# Patient Record
Sex: Female | Born: 1970 | Race: Black or African American | Hispanic: No | Marital: Single | Smoking: Never smoker
Health system: Southern US, Community
[De-identification: ages and names within clinical notes are randomized; demographics above are authoritative.]

## PROBLEM LIST (undated history)

## (undated) DIAGNOSIS — Z21 Asymptomatic human immunodeficiency virus [HIV] infection status: Secondary | ICD-10-CM

## (undated) DIAGNOSIS — D649 Anemia, unspecified: Secondary | ICD-10-CM

## (undated) DIAGNOSIS — B1920 Unspecified viral hepatitis C without hepatic coma: Secondary | ICD-10-CM

## (undated) DIAGNOSIS — B20 Human immunodeficiency virus [HIV] disease: Secondary | ICD-10-CM

## (undated) HISTORY — PX: TUBAL LIGATION: SHX77

---

## 2000-11-26 ENCOUNTER — Emergency Department (HOSPITAL_COMMUNITY): Admission: EM | Admit: 2000-11-26 | Discharge: 2000-11-26 | Payer: Self-pay | Admitting: Emergency Medicine

## 2000-11-26 ENCOUNTER — Encounter: Payer: Self-pay | Admitting: Emergency Medicine

## 2004-07-23 ENCOUNTER — Inpatient Hospital Stay (HOSPITAL_COMMUNITY): Admission: EM | Admit: 2004-07-23 | Discharge: 2004-08-10 | Payer: Self-pay | Admitting: Emergency Medicine

## 2004-08-17 ENCOUNTER — Encounter: Admission: RE | Admit: 2004-08-17 | Discharge: 2004-08-17 | Payer: Self-pay | Admitting: Otolaryngology

## 2004-08-19 ENCOUNTER — Encounter: Admission: RE | Admit: 2004-08-19 | Discharge: 2004-08-19 | Payer: Self-pay | Admitting: Otolaryngology

## 2005-10-09 IMAGING — CR DG CHEST 1V PORT
1 series · 1 of 1 positions shown · non-contrast
Comparison: [DATE] and 08/06/04.

CLINICAL DATA: 33-year-old female, insertion of tracheostomy.

[view not recorded]
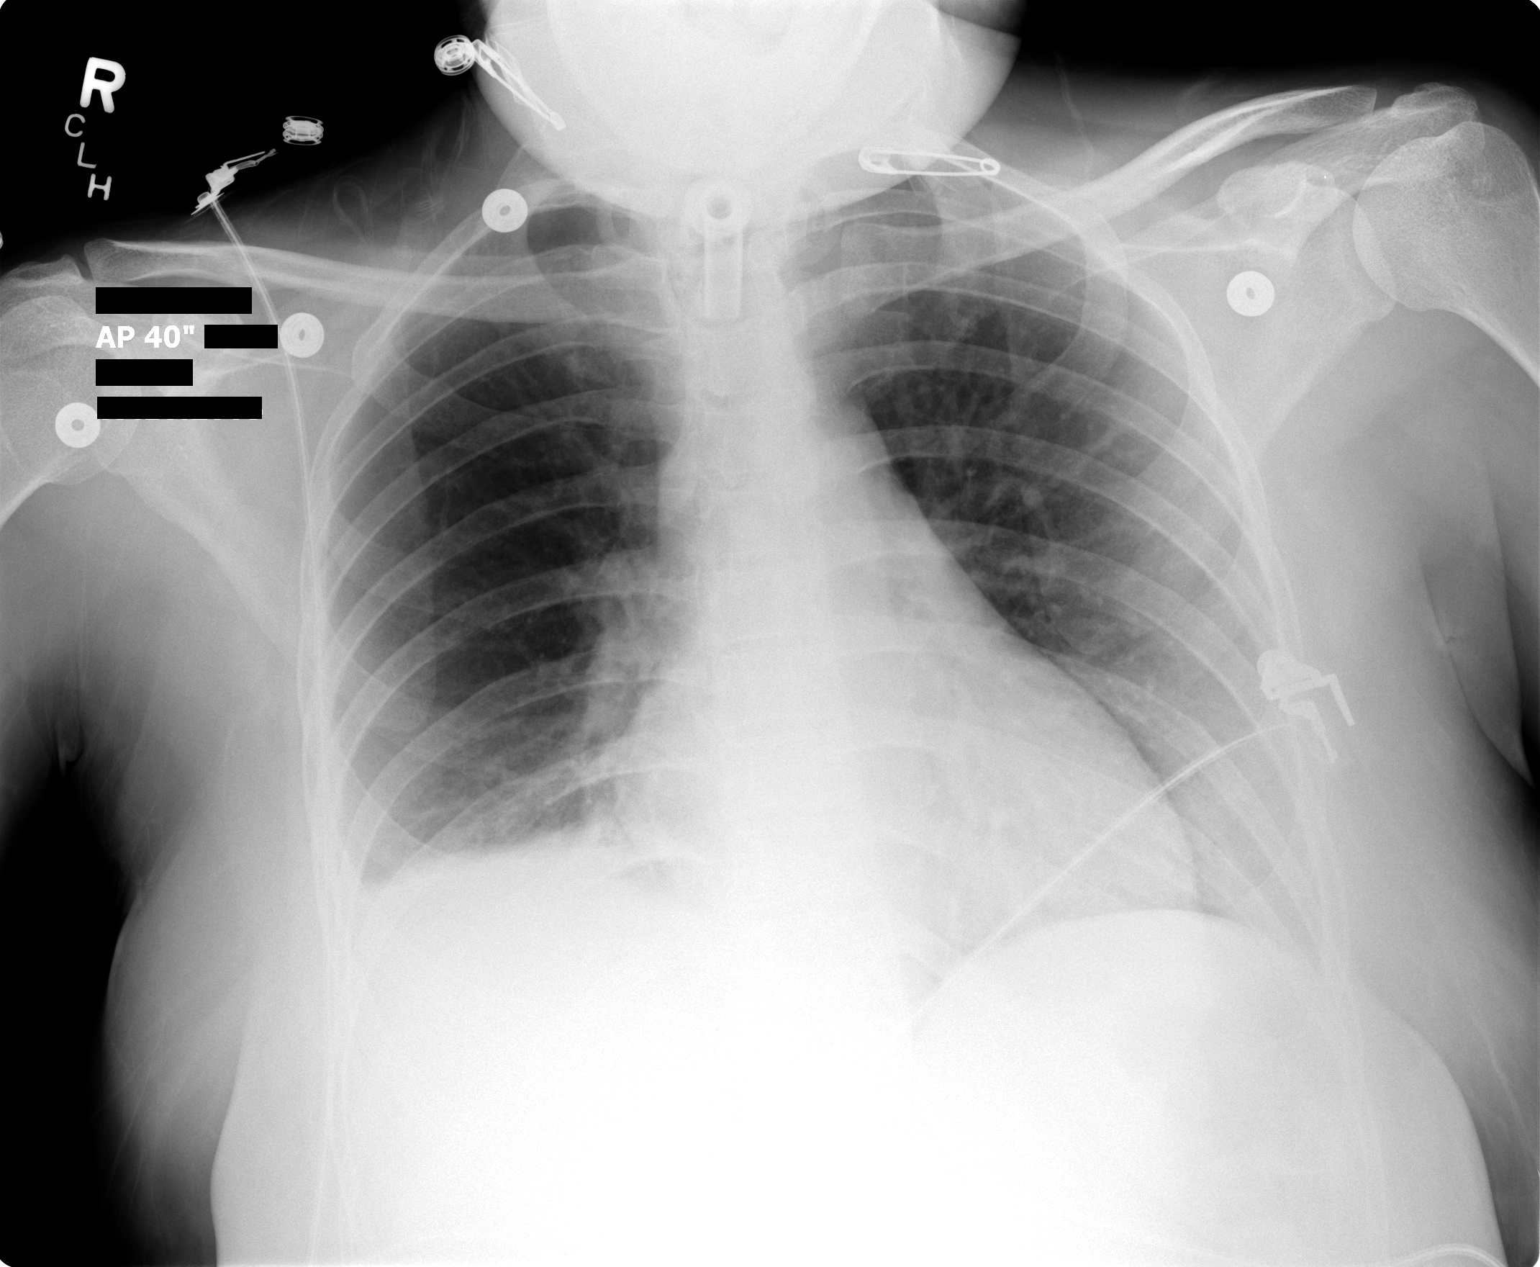

[1 of 1 positions shown; findings below may reference images not displayed]

CHEST PORTABLE ONE VIEW
 The tracheostomy tube projects over the midline of the trachea at the T3 level and appears in good position.  Stable right lower lobe atelectasis and small effusion.  Left lung remains well aerated.  Heart size is stable.
 IMPRESSION
 1.  Persistent right lower lobe atelectasis /air space disease and small effusion.  
 2.  Tracheostomy tube appears midline and at the T3 level.  
 3.  No pneumothorax.

## 2013-07-07 DIAGNOSIS — N6310 Unspecified lump in the right breast, unspecified quadrant: Secondary | ICD-10-CM

## 2013-07-08 ENCOUNTER — Ambulatory Visit (HOSPITAL_COMMUNITY)
Admission: RE | Admit: 2013-07-08 | Discharge: 2013-07-08 | Disposition: A | Discharge status: Home / Self Care | Payer: Self-pay | Source: Ambulatory Visit | Attending: Obstetrics and Gynecology | Admitting: Obstetrics and Gynecology

## 2013-07-08 ENCOUNTER — Encounter (HOSPITAL_COMMUNITY): Payer: Self-pay

## 2013-07-08 VITALS — BP 110/72 | Temp 98.1°F | Ht 70.0 in | Wt 243.6 lb

## 2013-07-08 DIAGNOSIS — Z01419 Encounter for gynecological examination (general) (routine) without abnormal findings: Secondary | ICD-10-CM

## 2013-07-08 HISTORY — DX: Human immunodeficiency virus (HIV) disease: B20

## 2013-07-08 HISTORY — DX: Asymptomatic human immunodeficiency virus (hiv) infection status: Z21

## 2013-07-08 HISTORY — DX: Anemia, unspecified: D64.9

## 2013-07-08 HISTORY — DX: Unspecified viral hepatitis C without hepatic coma: B19.20

## 2013-07-08 NOTE — Progress Notes (Signed)
Patient complained of two right breast lumps. Diagnostic mammogram on 02/25/13 in [place] reccommended a right breast biopsy for follow up.  Pap Smear:    Pap smear completed today. Patients last Pap smear was April 2013 and normal per patient. Per patient has a history of an abnormal Pap smear around 20 years ago that required cryo for follow up. No Pap smear results in EPIC.  Physical exam: Breasts Breasts symmetrical. No skin abnormalities bilateral breasts. No nipple retraction bilateral breasts. No nipple discharge bilateral breasts. No lymphadenopathy. No lumps palpated left breast. Palpated two small lumps within the right breast at 10 o'clock and 8 o'clock. Patient complained of tenderness when palpated lumps. Referred patient to the [facility] for right breast biopsy per recommendation. Appointment scheduled for Monday, July 21, 2013 at 1345.       Pelvic/Bimanual   Ext Genitalia No lesions, no swelling and no discharge observed on external genitalia.         Vagina Vagina pink and normal texture. No lesions or discharge observed in vagina.          Cervix Cervix is present. Cervix pink and of normal texture. No discharge observed.     Uterus Uterus is present and palpable. Uterus is tilted to the right and normal size.        Adnexae Bilateral ovaries present and palpable. No tenderness on palpation.          Rectovaginal No rectal exam completed today since patient had no rectal complaints. No skin abnormalities observed on exam.

## 2013-07-08 NOTE — Patient Instructions (Signed)
Taught Lori Bailey how to perform BSE and gave educational materials to take home. Told patient about free cervical cancer screenings to receive a Pap smear if would like one next year. Let her know she will need to have yearly Pap smears due to her history. Referred patient to the [facility] for right breast biopsy per recommendation. Appointment scheduled for Monday, July 21, 2013 at 1345. Patient aware of appointment and will be there. Let patient know will follow up with her within the next couple weeks with results for Pap smear. Lori Bailey verbalized understanding.  [place]ann[place], Kathaleen Maser, RN 10:24 AM

## 2013-07-21 ENCOUNTER — Ambulatory Visit
Admission: RE | Admit: 2013-07-21 | Discharge: 2013-07-21 | Disposition: A | Discharge status: Home / Self Care | Payer: No Typology Code available for payment source | Source: Ambulatory Visit | Attending: Obstetrics and Gynecology | Admitting: Obstetrics and Gynecology

## 2013-07-21 ENCOUNTER — Other Ambulatory Visit: Payer: Self-pay | Admitting: Obstetrics and Gynecology

## 2013-07-21 DIAGNOSIS — N6310 Unspecified lump in the right breast, unspecified quadrant: Secondary | ICD-10-CM

## 2013-07-29 ENCOUNTER — Ambulatory Visit: Payer: Self-pay

## 2013-09-29 ENCOUNTER — Emergency Department (HOSPITAL_COMMUNITY)
Admission: EM | Admit: 2013-09-29 | Discharge: 2013-09-29 | Disposition: A | Discharge status: Home / Self Care | Payer: Self-pay | Attending: Emergency Medicine | Admitting: Emergency Medicine

## 2013-09-29 ENCOUNTER — Encounter (HOSPITAL_COMMUNITY): Payer: Self-pay | Admitting: Emergency Medicine

## 2013-09-29 DIAGNOSIS — G479 Sleep disorder, unspecified: Secondary | ICD-10-CM | POA: Insufficient documentation

## 2013-09-29 DIAGNOSIS — M25559 Pain in unspecified hip: Secondary | ICD-10-CM | POA: Insufficient documentation

## 2013-09-29 DIAGNOSIS — M545 Low back pain, unspecified: Secondary | ICD-10-CM | POA: Insufficient documentation

## 2013-09-29 DIAGNOSIS — B1920 Unspecified viral hepatitis C without hepatic coma: Secondary | ICD-10-CM | POA: Insufficient documentation

## 2013-09-29 DIAGNOSIS — D649 Anemia, unspecified: Secondary | ICD-10-CM | POA: Insufficient documentation

## 2013-09-29 DIAGNOSIS — Z21 Asymptomatic human immunodeficiency virus [HIV] infection status: Secondary | ICD-10-CM | POA: Insufficient documentation

## 2013-09-29 DIAGNOSIS — M25551 Pain in right hip: Secondary | ICD-10-CM

## 2013-09-29 DIAGNOSIS — M549 Dorsalgia, unspecified: Secondary | ICD-10-CM

## 2013-09-29 DIAGNOSIS — M25569 Pain in unspecified knee: Secondary | ICD-10-CM | POA: Insufficient documentation

## 2013-09-29 MED ORDER — MELOXICAM 15 MG PO TABS: 15.0000 mg | ORAL_TABLET | Freq: Every day | ORAL | Status: DC

## 2013-09-29 NOTE — ED Notes (Signed)
Has recently dx w/ hep C and HIV and has had back pain for years but it has gotten worse and it esp hurts at night has had xrays for same but they have not found anything

## 2013-09-29 NOTE — ED Notes (Signed)
RX FOR MELOXICAM 15MG  CALLED TO [facility] ON [address]

## 2013-09-29 NOTE — ED Notes (Signed)
Patient C/O intermittent pain in her right  Hip for several years.  States that the pain has worsened in the last few months and does not go away. She also C/O low back pain that worsens at night. C/O pain shooting down her right leg from her hip.  C/O difficulty sleeping  At night due to the pain.  Patient was recently diagnosed with Hepatitis C and HIV 02/2012

## 2013-09-29 NOTE — ED Provider Notes (Signed)
CSN: [id number]     Arrival date & time 09/29/13  1047 History  This chart was scribed for Arthor Captain, PA working with Audree Camel, MD by Quintella Reichert, ED Scribe. This patient was seen in room TR07C/TR07C and the patient's care was started at 12:24 PM.   Chief Complaint  Patient presents with  . Back Pain    The history is provided by the patient. No language interpreter was used.    HPI Comments: Lori Bailey is a 42 y.o. female who presents to the Emergency Department complaining of bilateral lower back pain and right hip pain.  Pt states that she has had intermittent, progressively-worsening right hip pain for 5 years with no known injury. She has had multiple x-rays which were all normal to her knowledge. She also had an MRI scheduled several years ago but did not go because her pain resolved at that time.  In the past year her pain has worsened and has gone from an "aching" to a "burning stinging" pain.  Pain is worsened by prolonged sitting, lying flat, and lying on her right hip.  She has had difficulty sleeping due to pain.  It is relieved somewhat by taking a hot shower.  Back pain began several months ago and is more severe at night.  She has not taken any pain medications because she was diagnosed with HIV and hepatitis C 18-19 months ago.  Pt also complains of right knee pain.  She notes that she has sustained fractures in both feet when stepping down and rolling her feet, "both in the same way 10 years apart."  She has been advised by her PCP that her subsequent gait may be causing her pain in the right knee.    Past Medical History  Diagnosis Date  . HIV infection   . Hepatitis C   . Anemia     Past Surgical History  Procedure Laterality Date  . Tubal ligation      Family History  Problem Relation Age of Onset  . Breast cancer Mother   . Cancer Mother     uterine  . Seizures Mother   . Breast cancer Maternal Aunt   . Hypertension Sister   .  Hypertension Brother   . Diabetes Brother     History  Substance Use Topics  . Smoking status: Never Smoker   . Smokeless tobacco: Never Used  . Alcohol Use: Yes     Comment: socially on weekends    OB History   Grav Para Term Preterm Abortions TAB SAB Ect Mult Living   5 5 5       5       Review of Systems  Constitutional: Negative for fever.  Musculoskeletal: Positive for arthralgias and back pain.  Neurological: Negative for weakness and numbness.     Allergies  Review of patient's allergies indicates no known allergies.  Home Medications  No current outpatient prescriptions on file.  BP 119/88  Pulse 79  Temp(Src) 98.3 F (36.8 C) (Oral)  Resp 20  Ht 5\' 10"  (1.778 m)  Wt 227 lb 11.2 oz (103.284 kg)  BMI 32.67 kg/m2  SpO2 98%  Physical Exam  Nursing note and vitals reviewed. Constitutional: She is oriented to person, place, and time. She appears well-developed and well-nourished. No distress.  HENT:  Head: Normocephalic and atraumatic.  Eyes: EOM are normal.  Neck: Neck supple. No tracheal deviation present.  Cardiovascular: Normal rate.   Pulmonary/Chest: Effort normal. No respiratory  distress.  Musculoskeletal:  Tender to palpation over the right lumbar paraspinals and gluteal muscles.  No point tenderness over the hip.  No pain with internal rotation.  Mild pain with external rotation.  Strength intact.  Neurological: She is alert and oriented to person, place, and time.  Skin: Skin is warm and dry.  Psychiatric: She has a normal mood and affect. Her behavior is normal.    ED Course  Procedures (including critical care time)  DIAGNOSTIC STUDIES: Oxygen Saturation is 98% on room air, normal by my interpretation.    COORDINATION OF CARE: 12:39 PM-Discussed treatment plan which includes imaging, anti-inflammatories, stretching, heat, and orthopedic f/u with pt at bedside and pt agreed to plan.    Labs Review Labs Reviewed - No data to  display  Imaging Review No results found.  EKG Interpretation   None       MDM   1. Back pain   2. Hip pain, right   Patient with back/hip pain.  No neurological deficits and normal neuro exam.  Patient can walk but states is painful.  No loss of bowel or bladder control.  No concern for cauda equina.  No fever, night sweats, weight loss, h/o cancer, IVDU. Do not feel that imaging is warranted, negative straight leg raise, Do not suspect AVN/ fracture or dislocation. RICE protocol and pain medicine indicated and discussed with patient. \   I personally performed the services described in this documentation, which was scribed in my presence. The recorded information has been reviewed and is accurate.     Arthor Captain, PA-C 10/03/13 1156

## 2013-10-03 NOTE — ED Provider Notes (Signed)
Medical screening examination/treatment/procedure(s) were performed by non-physician practitioner and as supervising physician I was immediately available for consultation/collaboration.  EKG Interpretation   None         Madge Therrien T Harvie Morua, MD 10/03/13 1624 

## 2013-10-06 ENCOUNTER — Telehealth: Payer: Self-pay

## 2013-10-06 NOTE — Telephone Encounter (Signed)
Case manager, [place] informed patient was a no show for intake.   Laurell Josephs, RN

## 2013-11-10 ENCOUNTER — Emergency Department (HOSPITAL_COMMUNITY)
Admission: EM | Admit: 2013-11-10 | Discharge: 2013-11-10 | Disposition: A | Discharge status: Home / Self Care | Payer: Self-pay | Attending: Emergency Medicine | Admitting: Emergency Medicine

## 2013-11-10 ENCOUNTER — Encounter (HOSPITAL_COMMUNITY): Payer: Self-pay | Admitting: Emergency Medicine

## 2013-11-10 DIAGNOSIS — Z21 Asymptomatic human immunodeficiency virus [HIV] infection status: Secondary | ICD-10-CM | POA: Insufficient documentation

## 2013-11-10 DIAGNOSIS — M5431 Sciatica, right side: Secondary | ICD-10-CM

## 2013-11-10 DIAGNOSIS — Z79899 Other long term (current) drug therapy: Secondary | ICD-10-CM | POA: Insufficient documentation

## 2013-11-10 DIAGNOSIS — Z8619 Personal history of other infectious and parasitic diseases: Secondary | ICD-10-CM | POA: Insufficient documentation

## 2013-11-10 DIAGNOSIS — Z862 Personal history of diseases of the blood and blood-forming organs and certain disorders involving the immune mechanism: Secondary | ICD-10-CM | POA: Insufficient documentation

## 2013-11-10 DIAGNOSIS — IMO0002 Reserved for concepts with insufficient information to code with codable children: Secondary | ICD-10-CM | POA: Insufficient documentation

## 2013-11-10 DIAGNOSIS — M5430 Sciatica, unspecified side: Secondary | ICD-10-CM | POA: Insufficient documentation

## 2013-11-10 MED ORDER — PREDNISONE 20 MG PO TABS
60.0000 mg | ORAL_TABLET | Freq: Once | ORAL | Status: AC
  Administered 2013-11-10: 60 mg via ORAL
  Filled 2013-11-10: qty 3

## 2013-11-10 MED ORDER — OXYCODONE HCL 5 MG PO TABS: 5.0000 mg | ORAL_TABLET | Freq: Every evening | ORAL | Status: DC | PRN

## 2013-11-10 MED ORDER — KETOROLAC TROMETHAMINE 30 MG/ML IJ SOLN
30.0000 mg | Freq: Once | INTRAMUSCULAR | Status: AC
  Administered 2013-11-10: 30 mg via INTRAMUSCULAR
  Filled 2013-11-10: qty 1

## 2013-11-10 MED ORDER — OXYCODONE HCL 5 MG PO TABS
5.0000 mg | ORAL_TABLET | Freq: Once | ORAL | Status: AC
  Administered 2013-11-10: 5 mg via ORAL
  Filled 2013-11-10: qty 1

## 2013-11-10 MED ORDER — CYCLOBENZAPRINE HCL 10 MG PO TABS
5.0000 mg | ORAL_TABLET | Freq: Once | ORAL | Status: AC
  Administered 2013-11-10: 5 mg via ORAL
  Filled 2013-11-10: qty 1

## 2013-11-10 MED ORDER — CYCLOBENZAPRINE HCL 5 MG PO TABS: 5.0000 mg | ORAL_TABLET | Freq: Three times a day (TID) | ORAL | Status: DC

## 2013-11-10 MED ORDER — PREDNISONE 20 MG PO TABS: ORAL_TABLET | ORAL | Status: DC

## 2013-11-10 NOTE — ED Notes (Signed)
[facility] staff called and stated that they do not transport to the shelter on [address] where patient originally came from Will make charge nurse aware

## 2013-11-10 NOTE — ED Notes (Signed)
Bed: ZO10 Expected date:  Expected time:  Means of arrival:  Comments: EMS 43yo chronic hip pain

## 2013-11-10 NOTE — ED Provider Notes (Signed)
Medical screening examination/treatment/procedure(s) were performed by non-physician practitioner and as supervising physician I was immediately available for consultation/collaboration.  EKG Interpretation   None         Gwyneth Sprout, MD 11/10/13 0500

## 2013-11-10 NOTE — ED Notes (Signed)
Patient with chronic hip pain Patient has not taken any OTC medications because she has hx of Hep C and HIV, doesn't know what medications to take with these dx Patient does not taken any medications for Hep C or HIV  Patient ambulatory from ambulance bay to ED room

## 2013-11-10 NOTE — ED Notes (Addendum)
[facility] called and made aware of need to transport patient home 

## 2013-11-10 NOTE — ED Provider Notes (Signed)
CSN: [id number]     Arrival date & time 11/10/13  0201 History   First MD Initiated Contact with Patient 11/10/13 0217     Chief Complaint  Patient presents with  . Hip Pain    Right Hip  . 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 female presenting with hip pain. The history is provided by the patient.  Hip Pain This is a recurrent problem. The problem occurs intermittently. The problem has been unchanged. Pertinent negatives include no fever, joint swelling, numbness, rash, urinary symptoms or weakness. Exacerbated by: sitting. She has tried nothing for the symptoms. The treatment provided no relief.    Past Medical History  Diagnosis Date  . HIV infection   . Hepatitis C   . Anemia    Past Surgical History  Procedure Laterality Date  . Tubal ligation     Family History  Problem Relation Age of Onset  . Breast cancer Mother   . Cancer Mother     uterine  . Seizures Mother   . Breast cancer Maternal Aunt   . Hypertension Sister   . Hypertension Brother   . Diabetes Brother    History  Substance Use Topics  . Smoking status: Never Smoker   . Smokeless tobacco: Never Used  . Alcohol Use: Yes     Comment: socially on weekends   OB History   Grav Para Term Preterm Abortions TAB SAB Ect Mult Living   5 5 5       5       Review of Systems  Constitutional: Negative for fever.  Musculoskeletal: Positive for back pain and gait problem. Negative for joint swelling.  Skin: Negative for rash and wound.  Neurological: Negative for weakness and numbness.  All other systems reviewed and are negative.    Allergies  Review of patient's allergies indicates no known allergies.  Home Medications   Current Outpatient Rx  Name  Route  Sig  Dispense  Refill  . cyclobenzaprine (FLEXERIL) 5 MG tablet   Oral   Take 1 tablet (5 mg total) by mouth 3 (three) times daily.   30 tablet   0   . oxyCODONE (OXY IR/ROXICODONE) 5 MG immediate release tablet   Oral   Take 1 tablet (5 mg total) by mouth at bedtime and may repeat dose one time if needed.   12 tablet   0   . predniSONE (DELTASONE) 20 MG tablet      3 Tabs PO Days 1-3, then 2 tabs PO Days 4-6, then 1 tab PO Day 7-9, then Half Tab PO Day 10-12   20 tablet   0    BP 120/78  Pulse 113  Temp(Src) 99.1 F (37.3 C) (Oral)  Resp 20  SpO2 98% Physical Exam  Nursing note and vitals reviewed. Constitutional: She is  oriented to person, place, and time. She appears well-developed and well-nourished.  HENT:  Head: Normocephalic.  Eyes: Pupils are equal, round, and reactive to light.  Neck: Normal range of motion.  Cardiovascular: Normal rate and regular rhythm.   Pulmonary/Chest: Effort normal.  Musculoskeletal: She exhibits tenderness. She exhibits no edema.       Lumbar back: She exhibits pain. She exhibits normal range of motion.       Back:  Neurological: She is alert and oriented to person, place, and time.  Skin: Skin is warm. No rash noted. No erythema.    ED Course  Procedures (including critical care time) Labs Review Labs Reviewed - No data to display Imaging Review No results found.  EKG Interpretation   None       MDM   1. Sciatica of right side without back pain         Arman Filter, NP 11/10/13 [phone]

## 2014-01-29 ENCOUNTER — Other Ambulatory Visit: Payer: Self-pay

## 2014-01-29 DIAGNOSIS — Z1231 Encounter for screening mammogram for malignant neoplasm of breast: Secondary | ICD-10-CM

## 2014-01-30 ENCOUNTER — Ambulatory Visit
Admission: RE | Admit: 2014-01-30 | Discharge: 2014-01-30 | Disposition: A | Discharge status: Home / Self Care | Payer: No Typology Code available for payment source | Source: Ambulatory Visit

## 2014-01-30 DIAGNOSIS — Z1231 Encounter for screening mammogram for malignant neoplasm of breast: Secondary | ICD-10-CM

## 2014-03-07 ENCOUNTER — Encounter (HOSPITAL_COMMUNITY): Payer: Self-pay | Admitting: Emergency Medicine

## 2014-03-07 ENCOUNTER — Emergency Department (HOSPITAL_COMMUNITY)
Admission: EM | Admit: 2014-03-07 | Discharge: 2014-03-07 | Disposition: A | Discharge status: Home / Self Care | Payer: Self-pay | Attending: Emergency Medicine | Admitting: Emergency Medicine

## 2014-03-07 DIAGNOSIS — Z791 Long term (current) use of non-steroidal anti-inflammatories (NSAID): Secondary | ICD-10-CM | POA: Insufficient documentation

## 2014-03-07 DIAGNOSIS — Z8619 Personal history of other infectious and parasitic diseases: Secondary | ICD-10-CM | POA: Insufficient documentation

## 2014-03-07 DIAGNOSIS — Z862 Personal history of diseases of the blood and blood-forming organs and certain disorders involving the immune mechanism: Secondary | ICD-10-CM | POA: Insufficient documentation

## 2014-03-07 DIAGNOSIS — R209 Unspecified disturbances of skin sensation: Secondary | ICD-10-CM | POA: Insufficient documentation

## 2014-03-07 DIAGNOSIS — G8929 Other chronic pain: Secondary | ICD-10-CM | POA: Insufficient documentation

## 2014-03-07 DIAGNOSIS — F911 Conduct disorder, childhood-onset type: Secondary | ICD-10-CM | POA: Insufficient documentation

## 2014-03-07 DIAGNOSIS — M549 Dorsalgia, unspecified: Secondary | ICD-10-CM | POA: Insufficient documentation

## 2014-03-07 DIAGNOSIS — Z21 Asymptomatic human immunodeficiency virus [HIV] infection status: Secondary | ICD-10-CM | POA: Insufficient documentation

## 2014-03-07 DIAGNOSIS — Z79899 Other long term (current) drug therapy: Secondary | ICD-10-CM | POA: Insufficient documentation

## 2014-03-07 DIAGNOSIS — M25559 Pain in unspecified hip: Secondary | ICD-10-CM | POA: Insufficient documentation

## 2014-03-07 MED ORDER — OXYCODONE HCL 5 MG PO TABS: 5.0000 mg | ORAL_TABLET | ORAL | Status: DC | PRN

## 2014-03-07 MED ORDER — OXYCODONE HCL 5 MG PO TABS
5.0000 mg | ORAL_TABLET | Freq: Once | ORAL | Status: AC
  Administered 2014-03-07: 5 mg via ORAL
  Filled 2014-03-07: qty 1

## 2014-03-07 NOTE — ED Notes (Signed)
Pt in from home by ems. Pt has had back pain for a year. Family was pushing pt to come to ED. Apparently, has started walking hunched over from pain in last week.

## 2014-03-07 NOTE — Discharge Instructions (Signed)
Call for a follow up appointment with a Family or Primary Care Provider.  Call Dr. Noel Geroldohen for further evaluation and treatment of your chronic back pain. Return if Symptoms worsen.   Take medication as prescribed by your infectious disease doctor.

## 2014-03-07 NOTE — ED Notes (Signed)
Pt has a ride home.  

## 2014-03-07 NOTE — ED Provider Notes (Signed)
CSN: [id number]     Arrival date & time 03/07/14  1823 History  This chart was scribed for non-physician practitioner, Mellody DrownLauren Domanik Rainville, PA-C, working with Enid SkeensJoshua M Zavitz, MD by Smiley HousemanFallon Davis, ED Scribe. This patient was seen in room WTR5/WTR5 and the patient's care was started at 7:18 PM.  Chief Complaint  Patient presents with  . Back Pain   The history is provided by the patient. No language interpreter was used.   HPI Comments: Janalyn Rouseaula D Veitch is a 43 y.o. female who presents to the Emergency Department by EMS complaining of constant worsening back pain, onset 1 year ago.  Pt states the pain is central to her lumbar region.  Pt states she has associated numbness and tingling in her legs that started several months ago.  Pt reports the pain is keeping her up throughout the night.  She denies recent fall or trauma to the area. Pt has tried Aleve and gabapentin without relief. She reports bending over helps relieve the pain.  Reports laying down and movement increase discomfort. Pt was here on 09/29/2013 for the same complaint.  She states she has chronic right hip pain and is waiting to be referred to a specialist. Has never seen an orthopedist, pain specialist, spine specialist about her back.Pt has h/o HIV and Hep C.  Pt currently takes Triumeq for HIV.     Past Medical History  Diagnosis Date  . HIV infection   . Hepatitis C   . Anemia    Past Surgical History  Procedure Laterality Date  . Tubal ligation     Family History  Problem Relation Age of Onset  . Breast cancer Mother   . Cancer Mother     uterine  . Seizures Mother   . Breast cancer Maternal Aunt   . Hypertension Sister   . Hypertension Brother   . Diabetes Brother    History  Substance Use Topics  . Smoking status: Never Smoker   . Smokeless tobacco: Never Used  . Alcohol Use: Yes     Comment: socially on weekends   OB History   Grav Para Term Preterm Abortions TAB SAB Ect Mult Living   5 5 5       5       Review of Systems  Constitutional: Negative for fever and chills.  Gastrointestinal: Negative for nausea, vomiting, abdominal pain and diarrhea.  Musculoskeletal: Positive for back pain and gait problem. Negative for neck pain and neck stiffness.  Skin: Negative for color change and rash.  Neurological: Negative for weakness, numbness and headaches.  All other systems reviewed and are negative.   Allergies  Review of patient's allergies indicates no known allergies.  Home Medications   Current Outpatient Rx  Name  Route  Sig  Dispense  Refill  . Abacavir-Dolutegravir-Lamivud (TRIUMEQ) 600-50-300 MG TABS   Oral   Take 1 tablet by mouth daily.         Marland Kitchen. gabapentin (NEURONTIN) 300 MG capsule   Oral   Take 300 mg by mouth at bedtime.         . Multiple Vitamin (MULTIVITAMIN WITH MINERALS) TABS tablet   Oral   Take 1 tablet by mouth daily.         . naproxen sodium (ANAPROX) 220 MG tablet   Oral   Take 220 mg by mouth 2 (two) times daily with a meal.         . oxyCODONE (ROXICODONE) 5 MG immediate release tablet  Oral   Take 1 tablet (5 mg total) by mouth every 4 (four) hours as needed for severe pain.   8 tablet   0    Triage Vitals: BP 134/75  Pulse 120  Temp(Src) 98.4 F (36.9 C) (Oral)  Resp 16  SpO2 100%  LMP 02/15/2014  Physical Exam  Nursing note and vitals reviewed. Constitutional: She is oriented to person, place, and time. She appears well-developed and well-nourished. No distress.  Standing upright in room.  HENT:  Head: Normocephalic and atraumatic.  Eyes: Conjunctivae and EOM are normal. Left eye exhibits no discharge.  Neck: Neck supple.  Pulmonary/Chest: Effort normal. No respiratory distress.  Abdominal: Soft. She exhibits no distension.  Musculoskeletal: Normal range of motion. She exhibits no edema and no tenderness.       Back:  No midline C-spine, T-spine, or L-spine tenderness with no step-offs, crepitus, or deformities noted.  Exam limited by body habitus, there is a large amount of adipose tissue, unable to appreciate spasms in the low back. Sensation intact.  Good strength and equal bilaterally.  Motor intact.   Neurological: She is alert and oriented to person, place, and time.  Reflex Scores:      Patellar reflexes are 1+ on the right side and 1+ on the left side.      Achilles reflexes are 2+ on the right side and 2+ on the left side. Skin: Skin is warm and dry. No rash noted.  Psychiatric: She has a normal mood and affect.  Angry, and cussing thought the exam.    ED Course  Procedures (including critical care time)    COORDINATION OF CARE: 7:35 PM-Offered pt an x-ray, but she declined.  Per medical records she has taken oxycodone without complications.  Will discharge with oxycodone.  Informed pt to follow up with back specialist.  Patient informed of current plan of treatment and evaluation and agrees with plan.    MDM   Final diagnoses:  Chronic back pain   Patient with chronic back pain.  No neurological deficits and normal neuro exam.  Patient can walk but states is painful.  No loss of bowel or bladder control.  No concern for cauda equina.  No fever, night sweats, weight loss, h/o cancer, IVDU.  RICE protocol and pain medicine indicated and discussed with patient.  Pt was very angry and cussing at this provider after telling her she needed to follow up with a back specialist or a pain specialist for her low back pain for over 1 year.  I offered her an XR but she declined stating "You don't F-ing care about me", "No one believes I'm in F-ing pain for a year", "no one is going to do anything about my F-ing pain".  Discussed her HIV medication, Triumeq, limits medication she can be prescribed. EMR shows tolerated percocet in the past. Spine specialist referral given. Ambulating out of the ED, normal gait, bent over position.   Meds given in ED:  Medications  oxyCODONE (Oxy IR/ROXICODONE) immediate  release tablet 5 mg (5 mg Oral Given 03/07/14 2000)    Discharge Medication List as of 03/07/2014  7:45 PM    oxycodone 5mg   8tabs  I personally performed the services described in this documentation, which was scribed in my presence. The recorded information has been reviewed and is accurate.      Clabe Seal, PA-C 03/09/14 1434

## 2014-03-07 NOTE — ED Notes (Signed)
Bed: GNF6WTR5 Expected date:  Expected time:  Means of arrival:  Comments: ems

## 2014-03-11 NOTE — ED Provider Notes (Signed)
Medical screening examination/treatment/procedure(s) were performed by non-physician practitioner and as supervising physician I was immediately available for consultation/collaboration.   EKG Interpretation None        Enid SkeensJoshua M Kewana Sanon, MD 03/11/14 [id number]

## 2014-09-28 ENCOUNTER — Encounter (HOSPITAL_COMMUNITY): Payer: Self-pay | Admitting: Emergency Medicine

## 2014-11-02 ENCOUNTER — Encounter (HOSPITAL_COMMUNITY): Payer: Self-pay | Admitting: Family Medicine

## 2014-11-02 ENCOUNTER — Emergency Department (HOSPITAL_COMMUNITY)
Admission: EM | Admit: 2014-11-02 | Discharge: 2014-11-02 | Disposition: A | Discharge status: Home / Self Care | Payer: Self-pay | Attending: Emergency Medicine | Admitting: Emergency Medicine

## 2014-11-02 ENCOUNTER — Emergency Department (HOSPITAL_COMMUNITY): Payer: Self-pay

## 2014-11-02 DIAGNOSIS — R079 Chest pain, unspecified: Secondary | ICD-10-CM

## 2014-11-02 DIAGNOSIS — R05 Cough: Secondary | ICD-10-CM | POA: Insufficient documentation

## 2014-11-02 DIAGNOSIS — Z8619 Personal history of other infectious and parasitic diseases: Secondary | ICD-10-CM | POA: Insufficient documentation

## 2014-11-02 DIAGNOSIS — Z79899 Other long term (current) drug therapy: Secondary | ICD-10-CM | POA: Insufficient documentation

## 2014-11-02 DIAGNOSIS — Z21 Asymptomatic human immunodeficiency virus [HIV] infection status: Secondary | ICD-10-CM | POA: Insufficient documentation

## 2014-11-02 DIAGNOSIS — K209 Esophagitis, unspecified without bleeding: Secondary | ICD-10-CM

## 2014-11-02 DIAGNOSIS — D649 Anemia, unspecified: Secondary | ICD-10-CM | POA: Insufficient documentation

## 2014-11-02 LAB — BASIC METABOLIC PANEL
ANION GAP: 14 (ref 5–15)
BUN: 9 mg/dL (ref 6–23)
CO2: 23 mEq/L (ref 19–32)
CREATININE: 0.82 mg/dL (ref 0.50–1.10)
Calcium: 9.4 mg/dL (ref 8.4–10.5)
Chloride: 100 mEq/L (ref 96–112)
GFR calc Af Amer: >90 mL/min (ref >90)
GFR, EST NON AFRICAN AMERICAN: 85 mL/min — AB (ref >90)
Glucose, Bld: 93 mg/dL (ref 70–99)
Potassium: 3.8 mEq/L (ref 3.7–5.3)
Sodium: 137 mEq/L (ref 137–147)

## 2014-11-02 LAB — RAPID STREP SCREEN (MED CTR MEBANE ONLY): Streptococcus, Group A Screen (Direct): NEGATIVE

## 2014-11-02 LAB — CBC
HEMATOCRIT: 38.3 % (ref 36.0–46.0)
Hemoglobin: 13.1 g/dL (ref 12.0–15.0)
MCH: 29 pg (ref 26.0–34.0)
MCHC: 34.2 g/dL (ref 30.0–36.0)
MCV: 84.7 fL (ref 78.0–100.0)
PLATELETS: 355 10*3/uL (ref 150–400)
RBC: 4.52 MIL/uL (ref 3.87–5.11)
RDW: 13.5 % (ref 11.5–15.5)
WBC: 9.9 10*3/uL (ref 4.0–10.5)

## 2014-11-02 LAB — I-STAT TROPONIN, ED: Troponin i, poc: 0 ng/mL (ref 0.00–0.08)

## 2014-11-02 MED ORDER — PANTOPRAZOLE SODIUM 40 MG PO TBEC
40.0000 mg | DELAYED_RELEASE_TABLET | Freq: Once | ORAL | Status: AC
  Administered 2014-11-02: 40 mg via ORAL
  Filled 2014-11-02: qty 1

## 2014-11-02 MED ORDER — OMEPRAZOLE 20 MG PO CPDR: 20.0000 mg | DELAYED_RELEASE_CAPSULE | Freq: Two times a day (BID) | ORAL | Status: DC

## 2014-11-02 MED ORDER — GI COCKTAIL ~~LOC~~
30.0000 mL | Freq: Once | ORAL | Status: AC
  Administered 2014-11-02: 30 mL via ORAL
  Filled 2014-11-02: qty 30

## 2014-11-02 MED ORDER — HYDROCODONE-ACETAMINOPHEN 5-325 MG PO TABS
1.0000 | ORAL_TABLET | Freq: Once | ORAL | Status: AC
  Administered 2014-11-02: 1 via ORAL
  Filled 2014-11-02: qty 1

## 2014-11-02 NOTE — ED Provider Notes (Signed)
Patient seen and evaluated. She has a normal exam. Symptoms started with throat and chest pain after drinking some wine a few days ago. Per her most recent number she has a competent immune system despite her HIV. No clinical signs of suggest thrush or pharyngitis. No adenopathy in the neck. No asymmetry the neck. No tenderness in the floor the mouth or elevation of the tongue. No crepitus in the neck or chest. Height is simple esophagitis. Plan is proton pump inhibitor oral antacids.  Rolland PorterMark Amardeep Beckers, MD 11/02/14 2126

## 2014-11-02 NOTE — Discharge Instructions (Signed)
Avoid alcohol, tobacco, caffeine, and anti-inflammatories. Small frequent meals rather than large meals. Oral antacids as needed. Take Prilosec twice per day until you've had 2-3 days of no symptoms. Recheck with your primary care physician if not improving.  Esophagitis Esophagitis is inflammation of the esophagus. It can involve swelling, soreness, and pain in the esophagus. This condition can make it difficult and painful to swallow. CAUSES  Most causes of esophagitis are not serious. Many different factors can cause esophagitis, including:  Gastroesophageal reflux disease (GERD). This is when acid from your stomach flows up into the esophagus.  Recurrent vomiting.  An allergic-type reaction.  Certain medicines, especially those that come in large pills.  Ingestion of harmful chemicals, such as household cleaning products.  Heavy alcohol use.  An infection of the esophagus.  Radiation treatment for cancer.  Certain diseases such as sarcoidosis, Crohn's disease, and scleroderma. These diseases may cause recurrent esophagitis. SYMPTOMS   Trouble swallowing.  Painful swallowing.  Chest pain.  Difficulty breathing.  Nausea.  Vomiting.  Abdominal pain. DIAGNOSIS  Your caregiver will take your history and do a physical exam. Depending upon what your caregiver finds, certain tests may also be done, including:  Barium X-ray. You will drink a solution that coats the esophagus, and X-rays will be taken.  Endoscopy. A lighted tube is put down the esophagus so your caregiver can examine the area.  Allergy tests. These can sometimes be arranged through follow-up visits. TREATMENT  Treatment will depend on the cause of your esophagitis. In some cases, steroids or other medicines may be given to help relieve your symptoms or to treat the underlying cause of your condition. Medicines that may be recommended include:  Viscous lidocaine, to soothe the  esophagus.  Antacids.  Acid reducers.  Proton pump inhibitors.  Antiviral medicines for certain viral infections of the esophagus.  Antifungal medicines for certain fungal infections of the esophagus.  Antibiotic medicines, depending on the cause of the esophagitis. HOME CARE INSTRUCTIONS   Avoid foods and drinks that seem to make your symptoms worse.  Eat small, frequent meals instead of large meals.  Avoid eating for the 3 hours prior to your bedtime.  If you have trouble taking pills, use a pill splitter to decrease the size and likelihood of the pill getting stuck or injuring the esophagus on the way down. Drinking water after taking a pill also helps.  Stop smoking if you smoke.  Maintain a healthy weight.  Wear loose-fitting clothing. Do not wear anything tight around your waist that causes pressure on your stomach.  Raise the head of your bed 6 to 8 inches with wood blocks to help you sleep. Extra pillows will not help.  Only take over-the-counter or prescription medicines as directed by your caregiver. SEEK IMMEDIATE MEDICAL CARE IF:  You have severe chest pain that radiates into your arm, neck, or jaw.  You feel sweaty, dizzy, or lightheaded.  You have shortness of breath.  You vomit blood.  You have difficulty or pain with swallowing.  You have bloody or black, tarry stools.  You have a fever.  You have a burning sensation in the chest more than 3 times a week for more than 2 weeks.  You cannot swallow, drink, or eat.  You drool because you cannot swallow your saliva. MAKE SURE YOU:  Understand these instructions.  Will watch your condition.  Will get help right away if you are not doing well or get worse. Document Released: 12/21/2004 Document  Revised: 02/05/2012 Document Reviewed: 07/14/2011 ExitCare Patient Information 2015 [employer] [place]. This information is not intended to replace advice given to you by your health care provider. Make sure  you discuss any questions you have with your health care provider.

## 2014-11-02 NOTE — ED Notes (Signed)
Per pt sts she drank a little bit of wine that had been sitting in the fridge yesterday for a few days and since has been having chest pain, neck pain and throat pain. sts that it is more her neck and throat that hurt.

## 2014-11-02 NOTE — ED Provider Notes (Signed)
CSN: [id number]     Arrival date & time 11/02/14  1743 History   First MD Initiated Contact with Patient 11/02/14 1939     Chief Complaint  Patient presents with  . Chest Pain  . Sore Throat     (Consider location/radiation/quality/duration/timing/severity/associated sxs/prior Treatment) The history is provided by the patient and medical records. No language interpreter was used.     Lori Bailey is a 43 y.o. female  with a hx of HIV, Hep C, anemia presents to the Emergency Department complaining of gradual, persistent, progressively worsening chest pain, neck pain onset yesterday evening.  Pt reports she took 1 large sip of dry red wine yesterday out of a glass of red wine that had been in the refrigerator for several days before the football game and that caused her chest pain.  Pt reports she has had sore throat that radiates down her chest since drinking the wine.  She denies Hx of GERD.  She also denies epigastric pain. 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  Pt reports she has not had any dental problems or infections in the past work.  Pt reports subjective swelling under her jaw and around her neck.  No aggravating or alleviating factors.   Pt reports URI symptoms x 3 weeks with associated cough.    Versie StarksJoy McNeil - [id number]   Oct 2015 Labs: HIV,Ultraquant - Final result (08/27/2014 3:46 PM EDT) HIV,Ultraquant - Final result (08/27/2014 3:46 PM EDT)  Component Value Range  HIV, ULTRAQUANT TARGET NOT DETECTED c/ml  HIV, ULTRA LOG TARGET NOT DETECTED   HIV, ULTRAQUANT, COMMENT REFERENCE RANGE: TARGET NOT DETECTEDComment: RESULTS DETERMINED USING THE COBAS AMPLIPREP/TAQMAN REAL TIME PCR. THE REPORTABLE LINEAR RANGE OF THIS ASSAY IS 20 TO 1.0E+0.7. THIS TEST IS INTENDED FOR USE AS AN AID IN THE MANAGEMENT OF HIV-INFECTED INDIVIDUALS UNDERGOING ANTI-VIRAL THERAPY.    HIV,Ultraquant - Final result (08/27/2014 3:46 PM EDT)  Specimen   Blood    Helper Lymphocytes - Final result (08/27/2014 3:46 PM EDT) Helper Lymphocytes - Final result (08/27/2014 3:46 PM EDT)  Component Value Range  Lymphocytes 2.9 X1000  %Helper Lymphocytes 26.1 (L) 31-61 %  T-Helper Lymphs 0.76 0.31-3.11 X1000     Past Medical History  Diagnosis Date  . HIV infection   . Hepatitis C   . Anemia    Past Surgical History  Procedure Laterality Date  . Tubal ligation     Family History  Problem Relation Age of Onset  . Breast cancer Mother   . Cancer Mother     uterine  . Seizures Mother   . Breast cancer Maternal Aunt   . Hypertension Sister   . Hypertension Brother   . Diabetes Brother    History  Substance Use Topics  . Smoking status: Never Smoker   . Smokeless tobacco: Never Used  . Alcohol Use: Yes     Comment: socially on weekends   OB History    Gravida Para Term Preterm AB TAB SAB Ectopic Multiple Living   5 5 5       5      Review of Systems  Constitutional: Negative for fever, diaphoresis, appetite change, fatigue and unexpected weight change.  HENT: Positive for facial swelling ( subjective) and sore throat. Negative for mouth sores.   Eyes: Negative for visual disturbance.  Respiratory: Positive for cough (x3 weeks). Negative for chest tightness, shortness of breath and wheezing.  Cardiovascular: Positive for chest pain.  Gastrointestinal: Negative for nausea, vomiting, abdominal pain, diarrhea and constipation.  Endocrine: Negative for polydipsia, polyphagia and polyuria.  Genitourinary: Negative for dysuria, urgency, frequency and hematuria.  Musculoskeletal: Negative for back pain and neck stiffness.  Skin: Negative for rash.  Allergic/Immunologic: Negative for immunocompromised state.  Neurological: Negative for syncope, light-headedness and headaches.  Hematological: Does not bruise/bleed easily.  Psychiatric/Behavioral: Negative for sleep disturbance. The patient is not nervous/anxious.        Allergies  Review of patient's allergies indicates no known allergies.  Home Medications   Prior to Admission medications   Medication Sig Start Date End Date Taking? Authorizing Provider  Abacavir-Dolutegravir-Lamivud (TRIUMEQ) 600-50-300 MG TABS Take 1 tablet by mouth daily.   Yes Historical Provider, MD  Cyanocobalamin (B-12 PO) Take 1 tablet by mouth daily.   Yes Historical Provider, MD  vitamin C (ASCORBIC ACID) 500 MG tablet Take 1,000 mg by mouth daily.   Yes Historical Provider, MD  omeprazole (PRILOSEC) 20 MG capsule Take 1 capsule (20 mg total) by mouth 2 (two) times daily. 11/02/14   Rolland Porter, MD  oxyCODONE (ROXICODONE) 5 MG immediate release tablet Take 1 tablet (5 mg total) by mouth every 4 (four) hours as needed for severe pain. Patient not taking: Reported on 11/02/2014 03/07/14   Mellody Drown, PA-C   BP 119/83 mmHg  Pulse 91  Temp(Src) 98.3 F (36.8 C)  Resp 15  Wt 233 lb (105.688 kg)  SpO2 100%  LMP 10/27/2014 Physical Exam  Constitutional: She is oriented to person, place, and time. She appears well-developed and well-nourished. No distress.  Awake, alert, nontoxic appearance  HENT:  Head: Normocephalic and atraumatic.  Right Ear: Tympanic membrane, external ear and ear canal normal.  Left Ear: Tympanic membrane, external ear and ear canal normal.  Nose: Nose normal. No mucosal edema or rhinorrhea. No epistaxis. Right sinus exhibits no maxillary sinus tenderness and no frontal sinus tenderness. Left sinus exhibits no maxillary sinus tenderness and no frontal sinus tenderness.  Mouth/Throat: Uvula is midline and mucous membranes are normal. Mucous membranes are not pale, not dry and not cyanotic. No trismus in the jaw. No uvula swelling. No oropharyngeal exudate, posterior oropharyngeal edema, posterior oropharyngeal erythema or tonsillar abscesses.  Posterior oropharynx without erythema, edema or exudate on the tonsils Uvula midline without swelling No  elevation of the tongue Floor of the mouth soft without tenderness, woody induration or swelling Poor dentition throughout without gross abscess, erythema or swelling of the gingiva  Eyes: Conjunctivae are normal. Pupils are equal, round, and reactive to light. No scleral icterus.  Neck: Normal range of motion, full passive range of motion without pain and phonation normal. Neck supple. No tracheal tenderness, no spinous process tenderness and no muscular tenderness present. No rigidity. No erythema and normal range of motion present. No Brudzinski's sign and no Kernig's sign noted.  Range of motion without pain no No midline or paraspinal tenderness No TTP of the anterior neck No obvious swelling Normal phonation No stridor Handling secretions without difficulty No nuchal rigidity or meningeal signs No crepitus or sub Q air  Cardiovascular: Normal rate, regular rhythm, normal heart sounds and intact distal pulses.   Pulses:      Radial pulses are 2+ on the right side, and 2+ on the left side.  Pulmonary/Chest: Effort normal and breath sounds normal. No stridor. No respiratory distress. She has no decreased breath sounds. She has no wheezes. She exhibits tenderness.  Equal chest  expansion, clear and equal breath sounds without focal wheezes, rhonchi or rales  Abdominal: Soft. Bowel sounds are normal. She exhibits no mass. There is no tenderness. There is no rebound and no guarding.  Musculoskeletal: Normal range of motion. She exhibits no edema.  Lymphadenopathy:       Head (right side): Submandibular and tonsillar adenopathy present. No submental, no preauricular, no posterior auricular and no occipital adenopathy present.       Head (left side): Submandibular and tonsillar adenopathy present. No submental, no preauricular, no posterior auricular and no occipital adenopathy present.    She has no cervical adenopathy.       Right cervical: No superficial cervical, no deep cervical and no  posterior cervical adenopathy present.      Left cervical: No superficial cervical, no deep cervical and no posterior cervical adenopathy present.  Neurological: She is alert and oriented to person, place, and time.  Alert and oriented Moves all extremities without ataxia  Skin: Skin is warm and dry. No rash noted. She is not diaphoretic.  Psychiatric: She has a normal mood and affect.  Nursing note and vitals reviewed.   ED Course  Procedures (including critical care time) Labs Review Labs Reviewed  BASIC METABOLIC PANEL - Abnormal; Notable for the following:    GFR calc non Af Amer 85 (*)    All other components within normal limits  RAPID STREP SCREEN  CULTURE, GROUP A STREP  CBC  I-STAT TROPOININ, ED    Imaging Review Dg Chest 2 View  11/02/2014   CLINICAL DATA:  Acute chest pain  EXAM: CHEST  2 VIEW  COMPARISON:  08/17/2004  FINDINGS: The heart size and mediastinal contours are within normal limits. Both lungs are clear. The visualized skeletal structures are unremarkable.  IMPRESSION: No active cardiopulmonary disease.   Electronically Signed   By: Ruel Favorsrevor  Shick M.D.   On: 11/02/2014 18:44     EKG Interpretation   Date/Time:  Monday November 02 2014 17:52:40 EST Ventricular Rate:  94 PR Interval:  126 QRS Duration: 76 QT Interval:  358 QTC Calculation: 447 R Axis:   64 Text Interpretation:  Normal sinus rhythm with sinus arrhythmia Possible  Left atrial enlargement Nonspecific ST and T wave abnormality Abnormal ECG  Confirmed by Fayrene FearingJAMES  MD, MARK ([id number]) on 11/02/2014 11:54:06 PM         MDM   Final diagnoses:  Esophagitis   Lori Bailey presents with sore throat radiating down through her chest after drinking wine.  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  Pt without reproducible chest pain.  No cardiac risk factors and pt is not currently immunocompromised based [facility].    CXR without evidence of PNA.  Labs reassuring, troponin negative.  ECG nonischemic.  No concern for ACS and pt is low risk.  HEART score 1.    Pt with likely gastritis 2/2 to her wine consumption.  No evidence of Ludwig's angina and pt is afebrile, without tachycardia or other signs of systemic infection.  Will give GI cocktail, Protonix and plan for d/c home with PPI.    I have personally reviewed patient's vitals, nursing note and any pertinent labs or imaging.  I performed an undressed physical exam.    It has been determined that no acute conditions requiring further emergency intervention are present at this time. The patient/guardian have  been advised of the diagnosis and plan. I reviewed all labs and imaging including any potential incidental findings. We have discussed signs and symptoms that warrant return to the ED and they are listed in the discharge instructions.    Vital signs are stable at discharge.   BP 119/83 mmHg  Pulse 91  Temp(Src) 98.3 F (36.8 C)  Resp 15  Wt 233 lb (105.688 kg)  SpO2 100%  LMP 10/27/2014   The patient was discussed with and seen by Dr. Fayrene Fearing who agrees with the treatment plan.   Dahlia Client Raden Byington, PA-C 11/02/14 2359  Rolland Porter, MD 11/10/14 2245

## 2014-11-04 LAB — CULTURE, GROUP A STREP

## 2014-12-15 ENCOUNTER — Other Ambulatory Visit: Payer: Self-pay | Admitting: Obstetrics and Gynecology

## 2014-12-15 DIAGNOSIS — Z1231 Encounter for screening mammogram for malignant neoplasm of breast: Secondary | ICD-10-CM

## 2014-12-23 ENCOUNTER — Ambulatory Visit: Payer: Self-pay

## 2014-12-28 ENCOUNTER — Ambulatory Visit: Payer: Self-pay

## 2014-12-28 DIAGNOSIS — F4322 Adjustment disorder with anxiety: Secondary | ICD-10-CM

## 2014-12-28 NOTE — BH Specialist Note (Signed)
I met with Lori Bailey for the second time today and we completed a treatment plan with goals of improving her mood and reducing anxiety.  She talked some about how her friend upset her over the weekend and I discussed how sometimes we just want to be heard and not be given advice.  I provided some psycho-education on cognitive behavior therapy, explaining how our thoughts affect our feelings.  She told how she had a baby at home by herself, with 5 children in the house, all under 7.  Plan to meet in one week. Curley Spice, LCSW  [place]:  [phone]

## 2014-12-31 ENCOUNTER — Ambulatory Visit (HOSPITAL_COMMUNITY): Payer: Self-pay

## 2015-01-04 ENCOUNTER — Ambulatory Visit: Payer: Self-pay

## 2015-01-04 NOTE — BH Specialist Note (Signed)
Lori Bailey was in a fairly good mood today and talked about her family and how she has to limit her contact with them because of the "drama" that comes with it.  She said her mother called her today, but she didn't answer because she just doesn't like to deal with her.  When she said she spends a lot of time worrying about the future, I used it as an opportunity to talk about mindfulness and I also facilitated a guided meditation, which she responded to very well.  I told her about mindfulness apps on her smart phone and she said she plans to give this a try at home.  Plan to meet in one week. Franne FortsKenny Brigid Vandekamp, LCSW  [place]: [phone]

## 2015-01-11 ENCOUNTER — Ambulatory Visit: Payer: Self-pay

## 2015-01-18 ENCOUNTER — Ambulatory Visit: Payer: Self-pay

## 2015-01-19 ENCOUNTER — Other Ambulatory Visit (HOSPITAL_COMMUNITY): Payer: Self-pay | Admitting: *Deleted

## 2015-01-19 DIAGNOSIS — N644 Mastodynia: Secondary | ICD-10-CM

## 2015-01-28 ENCOUNTER — Ambulatory Visit (HOSPITAL_COMMUNITY): Admission: RE | Admit: 2015-01-28 | Payer: Self-pay | Source: Ambulatory Visit

## 2015-01-28 ENCOUNTER — Other Ambulatory Visit: Payer: Self-pay

## 2015-02-04 ENCOUNTER — Ambulatory Visit (HOSPITAL_COMMUNITY): Payer: Self-pay

## 2015-03-12 ENCOUNTER — Telehealth (HOSPITAL_COMMUNITY): Payer: Self-pay | Admitting: *Deleted

## 2015-03-12 NOTE — Telephone Encounter (Signed)
Telephoned patient at [phone] and left message to [facility]

## 2015-03-14 ENCOUNTER — Emergency Department (HOSPITAL_COMMUNITY): Payer: Self-pay

## 2015-03-14 ENCOUNTER — Emergency Department (HOSPITAL_COMMUNITY)
Admission: EM | Admit: 2015-03-14 | Discharge: 2015-03-15 | Disposition: A | Discharge status: Home / Self Care | Payer: Self-pay | Attending: Emergency Medicine | Admitting: Emergency Medicine

## 2015-03-14 DIAGNOSIS — S92352A Displaced fracture of fifth metatarsal bone, left foot, initial encounter for closed fracture: Secondary | ICD-10-CM | POA: Insufficient documentation

## 2015-03-14 DIAGNOSIS — Y929 Unspecified place or not applicable: Secondary | ICD-10-CM | POA: Insufficient documentation

## 2015-03-14 DIAGNOSIS — S92252A Displaced fracture of navicular [scaphoid] of left foot, initial encounter for closed fracture: Secondary | ICD-10-CM | POA: Insufficient documentation

## 2015-03-14 DIAGNOSIS — Z862 Personal history of diseases of the blood and blood-forming organs and certain disorders involving the immune mechanism: Secondary | ICD-10-CM | POA: Insufficient documentation

## 2015-03-14 DIAGNOSIS — Y9301 Activity, walking, marching and hiking: Secondary | ICD-10-CM | POA: Insufficient documentation

## 2015-03-14 DIAGNOSIS — Z8619 Personal history of other infectious and parasitic diseases: Secondary | ICD-10-CM | POA: Insufficient documentation

## 2015-03-14 DIAGNOSIS — Z21 Asymptomatic human immunodeficiency virus [HIV] infection status: Secondary | ICD-10-CM | POA: Insufficient documentation

## 2015-03-14 DIAGNOSIS — X58XXXA Exposure to other specified factors, initial encounter: Secondary | ICD-10-CM | POA: Insufficient documentation

## 2015-03-14 DIAGNOSIS — Y998 Other external cause status: Secondary | ICD-10-CM | POA: Insufficient documentation

## 2015-03-14 MED ORDER — IBUPROFEN 400 MG PO TABS
600.0000 mg | ORAL_TABLET | Freq: Once | ORAL | Status: AC
  Administered 2015-03-15: 600 mg via ORAL
  Filled 2015-03-14 (×2): qty 1

## 2015-03-14 NOTE — ED Provider Notes (Signed)
CSN: [id number]     Arrival date & time 03/14/15  2321 History   First MD Initiated Contact with Patient 03/14/15 2323     Chief Complaint  Patient presents with  . Foot Pain    left foot   Patient is a 44 y.o. female presenting with lower extremity pain. The history is provided by the patient.  Foot Pain This is a new problem. Episode onset: this evening. The problem occurs constantly. The symptoms are aggravated by walking and standing. The symptoms are relieved by rest. She has tried nothing for the symptoms. The treatment provided no relief.   patient was moving some furniture tonight. She twisted her ankle and foot when walking down the steps and fell. She denies any other injuries. She denies any knee pain.  Past Medical History  Diagnosis Date  . HIV infection   . Hepatitis C   . Anemia    Past Surgical History  Procedure Laterality Date  . Tubal ligation     Family History  Problem Relation Age of Onset  . Breast cancer Mother   . Cancer Mother     uterine  . Seizures Mother   . Breast cancer Maternal Aunt   . Hypertension Sister   . Hypertension Brother   . Diabetes Brother    History  Substance Use Topics  . Smoking status: Never Smoker   . Smokeless tobacco: Never Used  . Alcohol Use: Yes     Comment: socially on weekends   OB History    Gravida Para Term Preterm AB TAB SAB Ectopic Multiple Living   Review of Systems  All other systems reviewed and are negative.     Allergies  Review of patient's allergies indicates no known allergies.  Home Medications   Prior to Admission medications   Medication Sig Start Date End Date Taking? Authorizing Provider  Abacavir-Dolutegravir-Lamivud (TRIUMEQ) 600-50-300 MG TABS Take 1 tablet by mouth daily.    Historical Provider, MD  Cyanocobalamin (B-12 PO) Take 1 tablet by mouth daily.    Historical Provider, MD  naproxen (NAPROSYN) 500 MG tablet Take 1 tablet (500 mg total) by mouth 2 (two)  times daily. 03/15/15   Linwood Dibbles, MD  omeprazole (PRILOSEC) 20 MG capsule Take 1 capsule (20 mg total) by mouth 2 (two) times daily. 11/02/14   Rolland Porter, MD  oxyCODONE (ROXICODONE) 5 MG immediate release tablet Take 1 tablet (5 mg total) by mouth every 4 (four) hours as needed for severe pain. Patient not taking: Reported on 11/02/2014 03/07/14   Mellody Drown, PA-C  vitamin C (ASCORBIC ACID) 500 MG tablet Take 1,000 mg by mouth daily.    Historical Provider, MD   BP 126/83 mmHg  Pulse 71  Temp(Src) 97.7 F (36.5 C) (Oral)  Resp 18  Ht  (1.778 m)  Wt 225 lb (102.059 kg)  BMI 32.28 kg/m2  SpO2 100%  LMP 03/07/2015 Physical Exam  Constitutional: She appears well-developed and well-nourished. No distress.  HENT:  Head: Normocephalic and atraumatic.  Right Ear: External ear normal.  Left Ear: External ear normal.  Eyes: Conjunctivae are normal. Right eye exhibits no discharge. Left eye exhibits no discharge. No scleral icterus.  Neck: Neck supple. No tracheal deviation present.  Cardiovascular: Normal rate.   Pulmonary/Chest: Effort normal. No stridor. No respiratory distress.  Musculoskeletal: She exhibits no edema.       Left ankle: She  exhibits no swelling. Tenderness. Head of 5th metatarsal tenderness found. No lateral malleolus, no medial malleolus and no proximal fibula tenderness found. Achilles tendon normal.       Left foot: There is tenderness and bony tenderness.  Neurological: She is alert. Cranial nerve deficit: no gross deficits.  Skin: Skin is warm and dry. No rash noted.  Psychiatric: She has a normal mood and affect.  Nursing note and vitals reviewed.   ED Course  Procedures (including critical care time) Labs Review Labs Reviewed - No data to display  Imaging Review Dg Ankle Complete Left  03/15/2015   CLINICAL DATA:  Lateral foot and ankle pain status post twisting injury.  EXAM: LEFT ANKLE COMPLETE - 3+ VIEW  COMPARISON:  Contemporaneous foot radiographs   FINDINGS: Ankle mortise intact. No malleolar fracture identified. Small calcific densities along the dorsum and lateral aspect of the navicular an distal talus. Plantar calcaneal enthesophyte.  IMPRESSION: Ankle mortise intact.  Small calcific densities along the dorsum and lateral aspect of the talus and navicular, may reflect avulsion fractures. Correlate for point tenderness.   Electronically Signed   By: Jearld LeschAndrew  DelGaizo M.D.   On: 03/15/2015 00:45   Dg Foot Complete Left  03/15/2015   CLINICAL DATA:  Left lateral foot pain, injured while moving furniture due to twisting. Initial encounter.  EXAM: LEFT FOOT - COMPLETE 3+ VIEW  COMPARISON:  None.  FINDINGS: Hallux valgus deformity is noted with osseous overgrowth at the medial aspect of the head of first metatarsal. Contour deformity of the shaft of the fifth metatarsal likely represents an old, healed fracture. There is a 6 mm osseous fragment along the dorsal aspect of the navicular. A small plantar calcaneal enthesophyte is noted. There is no dislocation. No destructive osseous lesion is seen. No focal soft tissue abnormality is identified.  IMPRESSION: 1. Thin osseous density along the dorsal aspect of the navicular, which may reflect a small avulsion fracture of indeterminate age. Recommend correlation with any current pain/ point tenderness in this area. 2. Old fifth metatarsal shaft fracture.   Electronically Signed   By: Sebastian AcheAllen  Grady   On: 03/15/2015 00:44   Medications  ibuprofen (ADVIL,MOTRIN) tablet 600 mg (600 mg Oral Given 03/15/15 0004)     MDM   Final diagnoses:  Avulsion fracture of navicular bone of foot, left, closed, initial encounter    Splint, crutches.  Follow up with orthopedics as pt does have ttp in the midfoot region.    Linwood DibblesJon Yasemin Rabon, MD 03/15/15 [phone]

## 2015-03-14 NOTE — ED Notes (Signed)
Moving furniture tonight.  Twisted left ankle going down steps and fell.  Hx of foot fracture 5 years ago.

## 2015-03-15 MED ORDER — NAPROXEN 500 MG PO TABS: 500.0000 mg | ORAL_TABLET | Freq: Two times a day (BID) | ORAL | Status: DC

## 2015-03-15 NOTE — ED Notes (Signed)
Discharged home.  Education provided on use of crutches.  Verbalizes understanding and able to give return demonstration of use.

## 2015-03-15 NOTE — Discharge Instructions (Signed)
Tarsal Navicular Fracture  A fracture is a break in the bone. The tarsal navicular is a moderate sized bone of the midfoot on the inner side of the foot. This bone can be fractured in several ways and there are many different ways of treating these fractures. Some of the ways of treating these are as follows:  TREATMENT    Immobilization, which means the fracture is casted as it is without changing the positions of the fracture (bone pieces) involved. This procedure is used when the bone fragments remain in their normal position (are not displaced).   ORIF (open reduction and internal fixation), in which the fracture site is opened and the bone pieces are fixed into place with some type of hardware (screws or pins). This type of repair is usually used when the body of the navicular is fractured. It is indicated in most fractures that are displaced.  Your caregiver will discuss the type of fracture you have and the treatment that will be best for that problem. If surgery is the treatment of choice, the following is information for you to know and also let your caregiver know about prior to surgery.   LET YOUR CAREGIVER KNOW ABOUT:   Allergies.   Medicine taken including herbs, eye drops, over the counter medications, and creams.   Use of steroids (by mouth or creams)   Previous problems with anesthetics or novocaine.   Possibility of pregnancy, if this applies.   History of blood clots (thrombophlebitis).   History of bleeding or blood problems.   Previous surgery.   Other health problems.   Family history of anesthetic problems.  AFTER THE PROCEDURE  After surgery, you will be taken to the recovery area where a nurse will watch and check your progress. Once you are awake, stable, and taking fluids well, barring other problems you will be allowed to go home. Once home, an ice pack applied to your operative site may help with discomfort and keep the swelling down. Elevate your foot above your heart as much  as possible for the first 4-6 days after surgery.  HOME CARE INSTRUCTIONS    Follow your caregiver's instructions as to activities, exercises, physical therapy, and driving a car.   Daily exercise is helpful to prevent return of problems. Maintain strength and range of motion as instructed.   Only take over-the-counter or prescription medicines for pain, discomfort, or fever as directed by your caregiver.   See your caregiver as directed. It is very important to keep all follow-up referrals and appointments in order to avoid any long-term problems with your ankle and foot including chronic pain, inability to move the ankle or foot normally, and permanent disability.  SEEK MEDICAL CARE IF:    Increased bleeding (more than a small spot) from the wound or from beneath your cast or splint.   Redness, swelling, or increasing pain in the wound or from beneath your cast or splint.   Pus coming from wound or from beneath the cast, splint or fracture boot.   An unexplained oral temperature over 102 F (38.9 C).   A foul smell coming from the wound or dressing or from beneath your cast or splint.  SEEK IMMEDIATE MEDICAL CARE IF:    You begin to lose feeling in your foot or toes, or develop swelling of the foot or toes.   You get a cold or blue foot or toes on the injured side.   You develop pain not relieved by medicine.     You develop a rash.   You have difficulty breathing.   You have any allergic problems.  If you do not have a window in your cast for observing the wound, a discharge or minor bleeding may show up as a stain on the outside of your cast. Report these findings to your caregiver. If you are placed into a fracture boot after your operation, you should expect minor bleeding through the dressings. Change the dressings as instructed by your caregiver.  Document Released: 02/25/2001 Document Revised: 02/05/2012 Document Reviewed: 01/16/2014  [employer] Patient Information 2015 [employer]. This  information is not intended to replace advice given to you by your health care provider. Make sure you discuss any questions you have with your health care provider.

## 2015-04-20 ENCOUNTER — Other Ambulatory Visit (HOSPITAL_COMMUNITY): Payer: Self-pay | Admitting: *Deleted

## 2015-04-20 DIAGNOSIS — N644 Mastodynia: Secondary | ICD-10-CM

## 2015-05-13 ENCOUNTER — Ambulatory Visit (HOSPITAL_COMMUNITY)
Admission: RE | Admit: 2015-05-13 | Discharge: 2015-05-13 | Disposition: A | Discharge status: Home / Self Care | Payer: Self-pay | Source: Ambulatory Visit | Attending: Obstetrics and Gynecology | Admitting: Obstetrics and Gynecology

## 2015-05-13 ENCOUNTER — Encounter (HOSPITAL_COMMUNITY): Payer: Self-pay

## 2015-05-13 ENCOUNTER — Ambulatory Visit
Admission: RE | Admit: 2015-05-13 | Discharge: 2015-05-13 | Disposition: A | Discharge status: Home / Self Care | Payer: No Typology Code available for payment source | Source: Ambulatory Visit | Attending: Obstetrics and Gynecology | Admitting: Obstetrics and Gynecology

## 2015-05-13 VITALS — BP 120/76 | Temp 98.3°F | Ht 70.0 in | Wt 211.0 lb

## 2015-05-13 DIAGNOSIS — N644 Mastodynia: Secondary | ICD-10-CM

## 2015-05-13 DIAGNOSIS — Z1239 Encounter for other screening for malignant neoplasm of breast: Secondary | ICD-10-CM

## 2015-05-13 NOTE — Progress Notes (Signed)
CLINIC:  [facility] [employer]  REASON FOR VISIT: Well-woman exam and screening mammogram.    HISTORY OF PRESENT ILLNESS:  Lori Bailey is a 44 y.o. female who presents to the [facility] today for clinical breast exam. She endorses right breast and axilla pain that is intermittent at 5-6/10.  She has not noticed if this pain is related to her menstrual cycles.  She is unsure if she has felt a lump in the right breast.  She denies any nipple discharge or skin redness in bilateral breasts.  No family history of breast cancer.  Her last pap smear was in 06/2013 and was negative.     REVIEW OF SYSTEMS:  Denies any breast pain, nodularity, nipple inversion, or nipple discharge bilaterally. The patient is HIV (+).   ALLERGIES: No Known Allergies  CURRENT MEDICATIONS:  Current Outpatient Prescriptions on File Prior to Encounter  Medication Sig Dispense Refill  . Abacavir-Dolutegravir-Lamivud (TRIUMEQ) 600-50-300 MG TABS Take 1 tablet by mouth daily.    . Cyanocobalamin (B-12 PO) Take 1 tablet by mouth daily.    . naproxen (NAPROSYN) 500 MG tablet Take 1 tablet (500 mg total) by mouth 2 (two) times daily. 30 tablet 0  . omeprazole (PRILOSEC) 20 MG capsule Take 1 capsule (20 mg total) by mouth 2 (two) times daily. 60 capsule 0  . oxyCODONE (ROXICODONE) 5 MG immediate release tablet Take 1 tablet (5 mg total) by mouth every 4 (four) hours as needed for severe pain. (Patient not taking: Reported on 11/02/2014) 8 tablet 0  . vitamin C (ASCORBIC ACID) 500 MG tablet Take 1,000 mg by mouth daily.     No current facility-administered medications on file prior to encounter.     PHYSICAL EXAM:  Vitals:  Filed Vitals:   05/13/15 1122  BP: 120/76  Temp: 98.3 F (36.8 C)   General: Well-nourished, well-appearing female in no acute distress.  She is unaccompanied in clinic today.  Lori Bang, LPN was present during physical exam for this patient.  Breasts:  Bilateral breasts exposed and observed with patient standing (arms at side, arms on hips, arms on hips flexed forward, and arms over head).  No gross abnormalities including breast skin puckering or dimpling noted on observation.  Breasts symmetrical without evidence of skin redness, thickening, or peau d'orange appearance. No nipple retraction or nipple discharge noted bilaterally.  No breast nodularity palpated in bilateral breasts.  Normal fibrocystic breast changes noted in bilateral breasts. Axillary lymph nodes: No axillary lymphadenopathy bilaterally.   GU: Exam deferred. Pap smear is up-to-date.  ASSESSMENT & PLAN:   1. Breast cancer screening: Lori Bailey has no palpable breast abnormalities on her clinical breast exam today.  She will receive her screening mammogram as scheduled.  She will be contacted by the imaging center for results of the mammogram, either by letter or phone within the next few weeks.  She was given instructions and educational materials regarding breast self-awareness. Lori Bailey is aware of this plan and agrees with it.  2. Breast pain: I encouraged Lori Bailey to observe when the pain occurs to see if it correlates with her menstrual cycles.  We discussed that she could take any OTC pain relievers/anti-inflammatory medications to help with the pain.  We discussed the possibility of a breast cyst as a potential etiology of the breast pain.  She expressed verbal understanding and agrees with this plan.     Lori Bailey was encouraged to ask questions and  all questions were answered to her satisfaction.    Mike Craze, NP [place]  [phone]

## 2015-07-10 ENCOUNTER — Emergency Department (HOSPITAL_COMMUNITY)
Admission: EM | Admit: 2015-07-10 | Discharge: 2015-07-10 | Disposition: A | Discharge status: Home / Self Care | Payer: Self-pay | Attending: Emergency Medicine | Admitting: Emergency Medicine

## 2015-07-10 ENCOUNTER — Encounter (HOSPITAL_COMMUNITY): Payer: Self-pay | Admitting: *Deleted

## 2015-07-10 DIAGNOSIS — D649 Anemia, unspecified: Secondary | ICD-10-CM | POA: Insufficient documentation

## 2015-07-10 DIAGNOSIS — Z8619 Personal history of other infectious and parasitic diseases: Secondary | ICD-10-CM | POA: Insufficient documentation

## 2015-07-10 DIAGNOSIS — Z21 Asymptomatic human immunodeficiency virus [HIV] infection status: Secondary | ICD-10-CM | POA: Insufficient documentation

## 2015-07-10 DIAGNOSIS — H579 Unspecified disorder of eye and adnexa: Secondary | ICD-10-CM

## 2015-07-10 DIAGNOSIS — Z791 Long term (current) use of non-steroidal anti-inflammatories (NSAID): Secondary | ICD-10-CM | POA: Insufficient documentation

## 2015-07-10 DIAGNOSIS — Z862 Personal history of diseases of the blood and blood-forming organs and certain disorders involving the immune mechanism: Secondary | ICD-10-CM | POA: Insufficient documentation

## 2015-07-10 DIAGNOSIS — H11411 Vascular abnormalities of conjunctiva, right eye: Secondary | ICD-10-CM

## 2015-07-10 DIAGNOSIS — H109 Unspecified conjunctivitis: Secondary | ICD-10-CM | POA: Insufficient documentation

## 2015-07-10 DIAGNOSIS — Z79899 Other long term (current) drug therapy: Secondary | ICD-10-CM | POA: Insufficient documentation

## 2015-07-10 MED ORDER — ERYTHROMYCIN 5 MG/GM OP OINT
1.0000 "application " | TOPICAL_OINTMENT | Freq: Once | OPHTHALMIC | Status: AC
  Administered 2015-07-10: 1 via OPHTHALMIC
  Filled 2015-07-10: qty 3.5

## 2015-07-10 MED ORDER — FLUORESCEIN SODIUM 1 MG OP STRP
1.0000 | ORAL_STRIP | Freq: Once | OPHTHALMIC | Status: AC
  Administered 2015-07-10: 1 via OPHTHALMIC
  Filled 2015-07-10: qty 1

## 2015-07-10 NOTE — Discharge Instructions (Signed)

## 2015-07-10 NOTE — ED Notes (Signed)
Rt eye was itching on WED. Now the upper lid is swollen with drainage.

## 2015-07-10 NOTE — ED Notes (Signed)
Declined W/C at D/C and was escorted to lobby by RN. 

## 2015-07-10 NOTE — ED Provider Notes (Signed)
History  This chart was scribed for non-physician practitioner, Catha Gosselin, PA-C,working with Cathren Laine, MD, by Karle Plumber, ED Scribe. This patient was seen in room TR04C/TR04C and the patient's care was started at 12:24 PM.  Chief Complaint  Patient presents with  . Eye Problem   The history is provided by the patient and medical records. No language interpreter was used.    HPI Comments:  Lori Bailey is a 44 y.o. Hepatitis C/HIV positive female who presents to the Emergency Department complaining of right eye irritation and foreign body sensation that began three days ago. She reports worsening pain over the past three days but the foreign body sensation resolved and she reports rubbing the eye a lot. She reports associated eye redness. She reports mild matting and crusting of the eye. Pt states she has been rinsing the eye with cold water but denies taking or using any medications. She denies modifying factors. She denies visual changes, fever, chills, nausea or vomiting. Pt reports wearing corrective lenses but denies contact lenses.  Past Medical History  Diagnosis Date  . HIV infection   . Hepatitis C   . Anemia    Past Surgical History  Procedure Laterality Date  . Tubal ligation     Family History  Problem Relation Age of Onset  . Breast cancer Mother   . Cancer Mother     uterine  . Seizures Mother   . Breast cancer Maternal Aunt   . Hypertension Sister   . Hypertension Brother   . Diabetes Brother    Social History  Substance Use Topics  . Smoking status: Never Smoker   . Smokeless tobacco: Never Used  . Alcohol Use: Yes     Comment: socially on weekends   OB History    Gravida Para Term Preterm AB TAB SAB Ectopic Multiple Living   5 5 5       5      Review of Systems  Constitutional: Negative for fever and chills.  Eyes: Positive for pain and redness. Negative for visual disturbance.  Gastrointestinal: Negative for nausea and vomiting.     Allergies  Review of patient's allergies indicates no known allergies.  Home Medications   Prior to Admission medications   Medication Sig Start Date End Date Taking? Authorizing Provider  Abacavir-Dolutegravir-Lamivud (TRIUMEQ) 600-50-300 MG TABS Take 1 tablet by mouth daily.    Historical Provider, MD  Cyanocobalamin (B-12 PO) Take 1 tablet by mouth daily.    Historical Provider, MD  naproxen (NAPROSYN) 500 MG tablet Take 1 tablet (500 mg total) by mouth 2 (two) times daily. 03/15/15   Linwood Dibbles, MD  omeprazole (PRILOSEC) 20 MG capsule Take 1 capsule (20 mg total) by mouth 2 (two) times daily. 11/02/14   Rolland Porter, MD  oxyCODONE (ROXICODONE) 5 MG immediate release tablet Take 1 tablet (5 mg total) by mouth every 4 (four) hours as needed for severe pain. Patient not taking: Reported on 11/02/2014 03/07/14   Mellody Drown, PA-C  vitamin C (ASCORBIC ACID) 500 MG tablet Take 1,000 mg by mouth daily.    Historical Provider, MD   Triage Vitals: BP 123/76 mmHg  Pulse 79  Temp(Src) 98.1 F (36.7 C) (Oral)  Resp 18  Ht 5\' 10"  (1.778 m)  Wt 205 lb (92.987 kg)  BMI 29.41 kg/m2  SpO2 98% Physical Exam  Constitutional: She is oriented to person, place, and time. She appears well-developed and well-nourished.  HENT:  Head: Normocephalic and atraumatic.  Eyes:  EOM and lids are normal. Lids are everted and swept, no foreign bodies found. Right conjunctiva is injected. Right conjunctiva has no hemorrhage. Left conjunctiva is not injected. Left conjunctiva has no hemorrhage.  No orbital swelling.   Right eye is injected. I stained the eye with fluorescein and viewed under the [place] lamp. There was a small area of uptake to the right of the cornea. No corneal abrasion. Normal extraocular movements and pupils were round and reactive to light. No foreign bodies.  No matting or crusting on the eyelashes or around the eye.  Neck: Normal range of motion.  Cardiovascular: Normal rate.    Pulmonary/Chest: Effort normal.  Musculoskeletal: Normal range of motion.  Neurological: She is alert and oriented to person, place, and time.  Skin: Skin is warm and dry.  Psychiatric: She has a normal mood and affect. Her behavior is normal.  Nursing note and vitals reviewed.   ED Course  Procedures (including critical care time) DIAGNOSTIC STUDIES: Oxygen Saturation is 98% on RA, normal by my interpretation.    Visual Acuity  Right Eye Distance: 20/70 Left Eye Distance: 20/200 Bilateral Distance: 20/70  Right Eye Near:   Left Eye Near:    Bilateral Near:    COORDINATION OF CARE: 12:30 PM- Will instill Tetracaine drops and apply fluorescein strip to examine eye further. Pt verbalizes understanding and agrees to plan.  Medications  fluorescein ophthalmic strip 1 strip (1 strip Right Eye Given 07/10/15 1236)  erythromycin ophthalmic ointment 1 application (1 application Right Eye Given 07/10/15 1305)    Labs Review Labs Reviewed - No data to display  Imaging Review No results found.    EKG Interpretation None      MDM   Final diagnoses:  Injected eye, right  Conjunctivitis of right eye  Patient presents for foreign body sensation in the right eye. This may or may not be the beginning of conjunctivitis versus a foreign body that may have caused an abrasion to the right of the cornea. Patient was put on erythromycin ointment. I gave the patient ophthalmology referral and she will verbally agrees with the plan.   Medications  fluorescein ophthalmic strip 1 strip (1 strip Right Eye Given 07/10/15 1236)  erythromycin ophthalmic ointment 1 application (1 application Right Eye Given 07/10/15 1305)   I personally performed the services described in this documentation, which was scribed in my presence. The recorded information has been reviewed and is accurate.    Catha Gosselin, PA-C 07/10/15 1312  Cathren Laine, MD 07/10/15 [phone]

## 2015-07-22 ENCOUNTER — Emergency Department (HOSPITAL_COMMUNITY)
Admission: EM | Admit: 2015-07-22 | Discharge: 2015-07-22 | Disposition: A | Discharge status: Home / Self Care | Payer: Self-pay | Attending: Emergency Medicine | Admitting: Emergency Medicine

## 2015-07-22 ENCOUNTER — Encounter (HOSPITAL_COMMUNITY): Payer: Self-pay | Admitting: Family Medicine

## 2015-07-22 DIAGNOSIS — D649 Anemia, unspecified: Secondary | ICD-10-CM | POA: Insufficient documentation

## 2015-07-22 DIAGNOSIS — Z79899 Other long term (current) drug therapy: Secondary | ICD-10-CM | POA: Insufficient documentation

## 2015-07-22 DIAGNOSIS — Z791 Long term (current) use of non-steroidal anti-inflammatories (NSAID): Secondary | ICD-10-CM | POA: Insufficient documentation

## 2015-07-22 DIAGNOSIS — Z8619 Personal history of other infectious and parasitic diseases: Secondary | ICD-10-CM | POA: Insufficient documentation

## 2015-07-22 DIAGNOSIS — Z21 Asymptomatic human immunodeficiency virus [HIV] infection status: Secondary | ICD-10-CM | POA: Insufficient documentation

## 2015-07-22 DIAGNOSIS — N920 Excessive and frequent menstruation with regular cycle: Secondary | ICD-10-CM | POA: Insufficient documentation

## 2015-07-22 DIAGNOSIS — N938 Other specified abnormal uterine and vaginal bleeding: Secondary | ICD-10-CM | POA: Insufficient documentation

## 2015-07-22 LAB — COMPREHENSIVE METABOLIC PANEL
ALT: 7 U/L — ABNORMAL LOW (ref 14–54)
ANION GAP: 6 (ref 5–15)
AST: 13 U/L — ABNORMAL LOW (ref 15–41)
Albumin: 3.9 g/dL (ref 3.5–5.0)
Alkaline Phosphatase: 51 U/L (ref 38–126)
BILIRUBIN TOTAL: 0.9 mg/dL (ref 0.3–1.2)
BUN: 7 mg/dL (ref 6–20)
CO2: 24 mmol/L (ref 22–32)
Calcium: 9.1 mg/dL (ref 8.9–10.3)
Chloride: 111 mmol/L (ref 101–111)
Creatinine, Ser: 0.81 mg/dL (ref 0.44–1.00)
Glucose, Bld: 89 mg/dL (ref 65–99)
POTASSIUM: 3.4 mmol/L — AB (ref 3.5–5.1)
Sodium: 141 mmol/L (ref 135–145)
TOTAL PROTEIN: 7.4 g/dL (ref 6.5–8.1)

## 2015-07-22 LAB — CBC
HEMATOCRIT: 38.2 % (ref 36.0–46.0)
HEMOGLOBIN: 13.2 g/dL (ref 12.0–15.0)
MCH: 30.1 pg (ref 26.0–34.0)
MCHC: 34.6 g/dL (ref 30.0–36.0)
MCV: 87.2 fL (ref 78.0–100.0)
Platelets: 265 10*3/uL (ref 150–400)
RBC: 4.38 MIL/uL (ref 3.87–5.11)
RDW: 13.6 % (ref 11.5–15.5)
WBC: 6.2 10*3/uL (ref 4.0–10.5)

## 2015-07-22 MED ORDER — IBUPROFEN 400 MG PO TABS
600.0000 mg | ORAL_TABLET | Freq: Once | ORAL | Status: AC
  Administered 2015-07-22: 600 mg via ORAL
  Filled 2015-07-22 (×2): qty 1

## 2015-07-22 NOTE — Discharge Instructions (Signed)
Abnormal Uterine Bleeding Abnormal uterine bleeding means bleeding from the vagina that is not your normal menstrual period. This can be:  Bleeding or spotting between periods.  Bleeding after sex (sexual intercourse).  Bleeding that is heavier or more than normal.  Periods that last longer than usual.  Bleeding after menopause. There are many problems that may cause this. Treatment will depend on the cause of the bleeding. Any kind of bleeding that is not normal should be reviewed by your doctor.  HOME CARE Watch your condition for any changes. These actions may lessen any discomfort you are having:  Do not use tampons or douches as told by your doctor.  Change your pads often. You should get regular pelvic exams and Pap tests. Keep all appointments for tests as told by your doctor. GET HELP IF:  You are bleeding for more than 1 week.  You feel dizzy at times. GET HELP RIGHT AWAY IF:   You pass out.  You have to change pads every 15 to 30 minutes.  You have belly pain.  You have a fever.  You become sweaty or weak.  You are passing large blood clots from the vagina.  You feel sick to your stomach (nauseous) and throw up (vomit). MAKE SURE YOU:  Understand these instructions.  Will watch your condition.  Will get help right away if you are not doing well or get worse. Document Released: 09/10/2009 Document Revised: 11/18/2013 Document Reviewed: 06/12/2013 [facility] Patient Information 2015 [place], [place]. This information is not intended to replace advice given to you by your health care provider. Make sure you discuss any questions you have with your health care provider.  Menorrhagia Menorrhagia is when your menstrual periods are heavy or last longer than usual.  HOME CARE  Only take medicine as told by your doctor.  Take any iron pills as told by your doctor. Heavy bleeding may cause low levels of iron in your body.  Do not take aspirin 1 week before or  during your period. Aspirin can make the bleeding worse.  Lie down for a while if you change your tampon or pad more than once in 2 hours. This may help lessen the bleeding.  Eat a healthy diet and foods with iron. These foods include leafy green vegetables, meat, liver, eggs, and whole grain breads and cereals.  Do not try to lose weight. Wait until the heavy bleeding has stopped and your iron level is normal. GET HELP IF:  You soak through a pad or tampon every 1 or 2 hours, and this happens every time you have a period.  You need to use pads and tampons at the same time because you are bleeding so much.  You need to change your pad or tampon during the night.  You have a period that lasts for more than 8 days.  You pass clots bigger than 1 inch (2.5 cm) wide.  You have irregular periods that happen more or less often than once a month.  You feel dizzy or pass out (faint).  You feel very weak or tired.  You feel short of breath or feel your heart is beating too fast when you exercise.  You feel sick to your stomach (nausea) and you throw up (vomit) while you are taking your medicine.   You have watery poop (diarrhea) while you are taking your medicine.  You have any problems that may be related to the medicine you are taking.  GET HELP RIGHT AWAY IF:  You soak through 4 or more pads or tampons in 2 hours.  You have any bleeding while you are pregnant. MAKE SURE YOU:   Understand these instructions.  Will watch your condition.  Will get help right away if you are not doing well or get worse. Document Released: 08/22/2008 Document Revised: 07/16/2013 Document Reviewed: 05/15/2013 [facility] Patient Information 2015 [place], [place]. This information is not intended to replace advice given to you by your health care provider. Make sure you discuss any questions you have with your health care provider.

## 2015-07-22 NOTE — ED Notes (Signed)
Pt here for intermittent abd cramping with clots and bleeding since the 7th. sts the same thing happened years ago.

## 2015-07-22 NOTE — ED Provider Notes (Signed)
History   Chief Complaint  Patient presents with  . Vaginal Bleeding    HPI  Lori Bailey is a 44 y.o. female with PMH as below notable for HIV, Hep C, anemia, DUB who presents to ED with c/o VB.    Normal cycle described as 5 days every 4 wks. Pt reports having continuous VB since that is not heavier than normal but pt reports having worsening cramps than normal. No tx tried.  Onset of symptoms: gradual. Duration Since Aug 7.  Modifying factors none.  Severity: mild.    Associated symptoms: as above. Denies lightheadedness. Hx of similar symptoms: yes, several yrs ago had DUB when bleeding for 2 months straight.    Pt is on HAART therapy and takes as instructed.  Past medical/surgical history, social history, medications, allergies and FH have been reviewed with patient and/or in documentation. Furthermore, if pt family or friend(s) present, additional historical information was obtained from them.  Past Medical History  Diagnosis Date  . HIV infection   . Hepatitis C   . Anemia    Past Surgical History  Procedure Laterality Date  . Tubal ligation     Family History  Problem Relation Age of Onset  . Breast cancer Mother   . Cancer Mother     uterine  . Seizures Mother   . Breast cancer Maternal Aunt   . Hypertension Sister   . Hypertension Brother   . Diabetes Brother    Social History  Substance Use Topics  . Smoking status: Never Smoker   . Smokeless tobacco: Never Used  . Alcohol Use: Yes     Comment: socially on weekends     Review of Systems Constitutional: - F/C, -fatigue.  HENT: - congestion, -rhinorrhea, -sore throat.   Eyes: - eye pain, -visual disturbance.  Respiratory: - cough, -SOB, -hemoptysis.   Cardiovascular: - CP, -palps.  Gastrointestinal: - N/V/D, -abd pain. + menstrual cramps  Genitourinary: - flank pain, -dysuria, -frequency. +VB Musculoskeletal: - myalgia/arthritis, -joint swelling, -gait abnormality, -back pain, -neck  pain/stiffness, -leg pain/swelling.  Skin: - rash/lesion.  Neurological: - focal weakness, -lightheadedness, -dizziness, -numbness, -HA.  All other systems reviewed and are negative.   Physical Exam  Physical Exam  ED Triage Vitals  Enc Vitals Group     BP 07/22/15 1625 126/84 mmHg     Pulse Rate 07/22/15 1625 82     Resp 07/22/15 1625 16     Temp 07/22/15 1625 98.2 F (36.8 C)     Temp Source 07/22/15 1625 Oral     SpO2 07/22/15 1625 100 %     Weight --      Height --      Head Cir --      Peak Flow --      Pain Score 07/22/15 1625 3     Pain Loc --      Pain Edu? --      Excl. in GC? --    Constitutional: Patient is well appearing fem and in no acute distress Head: Normocephalic and atraumatic.  Eyes: Extraocular motion intact, no scleral icterus Mouth: MMM, OP clear Neck: Supple without meningismus, mass, or overt JVD Respiratory: No respiratory distress. Normal WOB. No w/r/g. CV: RRR, no obvious murmurs.  Pulses +2 and symmetric. Euvolemic Abdomen: Soft, NT, ND, no r/g. No mass.  MSK: Extremities are atraumatic without deformity, ROM intact Skin: Warm, dry, intact without rash Neuro: AAOx4, MAE 5/5 sym, no focal deficit noted  ED Course  Procedures   Labs Reviewed  COMPREHENSIVE METABOLIC PANEL - Abnormal; Notable for the following:    Potassium 3.4 (*)    AST 13 (*)    ALT 7 (*)    All other components within normal limits  CBC  URINALYSIS, ROUTINE W REFLEX MICROSCOPIC (NOT AT [facility])   I personally reviewed and interpreted all labs.  No results found. I personally viewed above image(s) which were used in my medical decision making. Formal interpretations by Radiology.   MDM: Lori Bailey is a 44 y.o. female with H&P as above who p/w CC: VB  Clinical picture consistent with dysfunctional uterine bleeding. Patient reports having only mild bleeding and there is no indication at this time to obtain pelvic labs are performed pelvic exam. Patient's  hemoglobin is stable at 13 and not having other sxs. She is a benign abdominal exam. I advised patient try NSAIDs over-the-counter. I advised her to follow closely with her primary doctor. Strict return precautions have been discussed. No indication for starting birth control pills or other medications for bleeding at this time. Stable for discharge. Old records reviewed (if available). Labs and imaging reviewed personally by myself and considered in medical decision making if ordered.  Clinical Impression: 1. DUB (dysfunctional uterine bleeding)   2. Menorrhagia with regular cycle     Disposition: Discharge  Condition: Good  I have discussed the results, Dx and Tx plan with the pt(& family if present). He/she/they expressed understanding and agree(s) with the plan. Discharge instructions discussed at great length. Strict return precautions discussed and pt &/or family have verbalized understanding of the instructions. No further questions at time of discharge.    New Prescriptions   No medications on file    Follow Up: Drema Halon, MD [facility] [place] [address] [phone]  Schedule an appointment as soon as possible for a visit in 1 week   [facility] EMERGENCY DEPARTMENT [address] [id number] mc [place] [address] [phone]  If symptoms worsen   Pt seen in conjunction with Dr. Cathren Laine, MD  Ames Dura, DO [facility] Emergency Medicine Resident - PGY-3      Ames Dura, MD 07/23/15 0110  Cathren Laine, MD 07/25/15 [phone]

## 2015-12-07 ENCOUNTER — Telehealth (HOSPITAL_COMMUNITY): Payer: Self-pay | Admitting: *Deleted

## 2015-12-07 NOTE — Telephone Encounter (Signed)
Telephoned patient at mobile # and left message to return call to [facility] 

## 2015-12-08 ENCOUNTER — Telehealth (HOSPITAL_COMMUNITY): Payer: Self-pay | Admitting: *Deleted

## 2015-12-08 NOTE — Telephone Encounter (Signed)
[facility] Patient stated was having pain and feels no one is listening to her. She states she also told the [facility] about this pain and nothing was done. I advised patient we could re-evaluate her in the [facility] program and refer her back to the Breast center if that was something she would like to do. The phone call got disconnected. Tried to call the patient back and left message advising we had an appointment tomorrow Jan 12 at 2:00 if she would like that to call back and we could discuss.

## 2016-03-14 ENCOUNTER — Emergency Department (HOSPITAL_COMMUNITY)
Admission: EM | Admit: 2016-03-14 | Discharge: 2016-03-14 | Disposition: A | Discharge status: Home / Self Care | Payer: Self-pay | Attending: Emergency Medicine | Admitting: Emergency Medicine

## 2016-03-14 DIAGNOSIS — R42 Dizziness and giddiness: Secondary | ICD-10-CM | POA: Insufficient documentation

## 2016-03-14 DIAGNOSIS — Z3202 Encounter for pregnancy test, result negative: Secondary | ICD-10-CM | POA: Insufficient documentation

## 2016-03-14 DIAGNOSIS — Z862 Personal history of diseases of the blood and blood-forming organs and certain disorders involving the immune mechanism: Secondary | ICD-10-CM | POA: Insufficient documentation

## 2016-03-14 DIAGNOSIS — N938 Other specified abnormal uterine and vaginal bleeding: Secondary | ICD-10-CM

## 2016-03-14 DIAGNOSIS — Z79899 Other long term (current) drug therapy: Secondary | ICD-10-CM | POA: Insufficient documentation

## 2016-03-14 DIAGNOSIS — Z9851 Tubal ligation status: Secondary | ICD-10-CM | POA: Insufficient documentation

## 2016-03-14 DIAGNOSIS — B20 Human immunodeficiency virus [HIV] disease: Secondary | ICD-10-CM | POA: Insufficient documentation

## 2016-03-14 LAB — COMPREHENSIVE METABOLIC PANEL WITH GFR
ALT: 17 U/L (ref 14–54)
AST: 40 U/L (ref 15–41)
Albumin: 4 g/dL (ref 3.5–5.0)
Alkaline Phosphatase: 50 U/L (ref 38–126)
Anion gap: 7 (ref 5–15)
BUN: 7 mg/dL (ref 6–20)
CO2: 24 mmol/L (ref 22–32)
Calcium: 8.9 mg/dL (ref 8.9–10.3)
Chloride: 111 mmol/L (ref 101–111)
Creatinine, Ser: 0.93 mg/dL (ref 0.44–1.00)
GFR calc Af Amer: >60 mL/min
GFR calc non Af Amer: >60 mL/min
Glucose, Bld: 92 mg/dL (ref 65–99)
Potassium: 4.5 mmol/L (ref 3.5–5.1)
Sodium: 142 mmol/L (ref 135–145)
Total Bilirubin: 0.6 mg/dL (ref 0.3–1.2)
Total Protein: 7.7 g/dL (ref 6.5–8.1)

## 2016-03-14 LAB — CBC WITH DIFFERENTIAL/PLATELET
BASOS PCT: 0 %
Basophils Absolute: 0 10*3/uL (ref 0.0–0.1)
EOS PCT: 0 %
Eosinophils Absolute: 0 10*3/uL (ref 0.0–0.7)
HEMATOCRIT: 33.5 % — AB (ref 36.0–46.0)
Hemoglobin: 11.6 g/dL — ABNORMAL LOW (ref 12.0–15.0)
Lymphocytes Relative: 10 %
Lymphs Abs: 0.7 10*3/uL (ref 0.7–4.0)
MCH: 29.4 pg (ref 26.0–34.0)
MCHC: 34.6 g/dL (ref 30.0–36.0)
MCV: 84.8 fL (ref 78.0–100.0)
MONO ABS: 0.3 10*3/uL (ref 0.1–1.0)
MONOS PCT: 4 %
Neutro Abs: 6 10*3/uL (ref 1.7–7.7)
Neutrophils Relative %: 86 %
PLATELETS: 217 10*3/uL (ref 150–400)
RBC: 3.95 MIL/uL (ref 3.87–5.11)
RDW: 13.8 % (ref 11.5–15.5)
WBC: 7 10*3/uL (ref 4.0–10.5)

## 2016-03-14 LAB — I-STAT BETA HCG BLOOD, ED (MC, WL, AP ONLY): I-stat hCG, quantitative: <5 m[IU]/mL

## 2016-03-14 MED ORDER — SODIUM CHLORIDE 0.9 % IV BOLUS (SEPSIS)
1000.0000 mL | Freq: Once | INTRAVENOUS | Status: AC
  Administered 2016-03-14: 1000 mL via INTRAVENOUS

## 2016-03-14 MED ORDER — KETOROLAC TROMETHAMINE 30 MG/ML IJ SOLN
30.0000 mg | Freq: Once | INTRAMUSCULAR | Status: AC
  Administered 2016-03-14: 30 mg via INTRAVENOUS
  Filled 2016-03-14: qty 1

## 2016-03-14 MED ORDER — ONDANSETRON HCL 4 MG/2ML IJ SOLN
4.0000 mg | Freq: Once | INTRAMUSCULAR | Status: AC
  Administered 2016-03-14: 4 mg via INTRAVENOUS
  Filled 2016-03-14: qty 2

## 2016-03-14 NOTE — ED Notes (Signed)
Pt. Is unable to use the restroom at this time, but is aware that we need a urine specimen.  

## 2016-03-14 NOTE — ED Notes (Signed)
Bed: [id number] Expected date:  Expected time:  Means of arrival:  Comments: EMS- 44yo F, n/v

## 2016-03-14 NOTE — ED Notes (Signed)
1 STICK IN RT AC UNSUCCESSFUL,1 STICK RT HAND VEIN BLEW

## 2016-03-14 NOTE — ED Notes (Signed)
Per EMS pt c/o menstrual cramp pain, nausea, and vomiting. Pt arrived alert and oriented x4.

## 2016-03-18 NOTE — ED Provider Notes (Signed)
CSN: [id number]     Arrival date & time 03/14/16  1157 History   First MD Initiated Contact with Patient 03/14/16 1220     Chief Complaint  Patient presents with  . Menstrual Cramps   . Emesis     (Consider location/radiation/quality/duration/timing/severity/associated sxs/prior Treatment) HPI  Lori Bailey is a 45 y.o F with a pmhx of HIV, hepatitis C who presents to the ED today c/o abdominal cramping. Pt states that this morning when she woke up she noticed that her menstrual cycle had started. Pt had associated abdominal cramping that is unrelieved by applying a heating pad or with Aleve. Pt states that she stood up while at work and felt very hot and flushed in the face. Pt had associated dizziness. Pt states this episode lasted for approximately 5-10 minutes. Pt states that she feels like she is going through menopause as she is no longer getting her periods regularly. Her LMP prior to today was in January. Pt denies heavy bleeding today but states that she has a hx of anemia and is concerned that her blood counts might be low. Pt states that she has missed several doses of her HAART medication recently. She is scheduled to see her ID doctor next month. She does not recall what her last CD4 count was. Denies fevers, chills, syncope, CP, SOB, N/V/D, dysuria, palpitations.    Past Medical History  Diagnosis Date  . HIV infection   . Hepatitis C   . Anemia    Past Surgical History  Procedure Laterality Date  . Tubal ligation     Family History  Problem Relation Age of Onset  . Breast cancer Mother   . Cancer Mother     uterine  . Seizures Mother   . Breast cancer Maternal Aunt   . Hypertension Sister   . Hypertension Brother   . Diabetes Brother    Social History  Substance Use Topics  . Smoking status: Never Smoker   . Smokeless tobacco: Never Used  . Alcohol Use: Yes     Comment: socially on weekends   OB History    Gravida Para Term Preterm AB TAB SAB Ectopic  Multiple Living   Review of Systems  All other systems reviewed and are negative.     Allergies  Review of patient's allergies indicates no known allergies.  Home Medications   Prior to Admission medications   Medication Sig Start Date End Date Taking? Authorizing Provider  naproxen sodium (ANAPROX) 220 MG tablet Take 220 mg by mouth daily as needed (pain).   Yes Historical Provider, MD  ODEFSEY 200-25-25 MG TABS tablet Take 1 tablet by mouth daily. 01/29/16  Yes Historical Provider, MD   BP 117/63 mmHg  Pulse 78  Temp(Src) 97.6 F (36.4 C) (Oral)  Resp 16  SpO2 100%  LMP 03/14/2016 Physical Exam  Constitutional: She is oriented to person, place, and time. She appears well-developed and well-nourished. No distress.  HENT:  Head: Normocephalic and atraumatic.  Mouth/Throat: No oropharyngeal exudate.  Eyes: Conjunctivae and EOM are normal. Pupils are equal, round, and reactive to light. Right eye exhibits no discharge. Left eye exhibits no discharge. No scleral icterus.  Cardiovascular: Normal rate, regular rhythm, normal heart sounds and intact distal pulses.  Exam reveals no gallop and no friction rub.   No murmur heard. Pulmonary/Chest: Effort normal and breath sounds normal. No respiratory distress. She has no  wheezes. She has no rales. She exhibits no tenderness.  Abdominal: Soft. Bowel sounds are normal. She exhibits no distension and no mass. There is no tenderness. There is no rebound and no guarding.  Musculoskeletal: Normal range of motion. She exhibits no edema.  Neurological: She is alert and oriented to person, place, and time.  Skin: Skin is warm and dry. No rash noted. She is not diaphoretic. No erythema. No pallor.  Psychiatric: She has a normal mood and affect. Her behavior is normal.  Nursing note and vitals reviewed.   ED Course  Procedures (including critical care time) Labs Review Labs Reviewed  CBC WITH DIFFERENTIAL/PLATELET -  Abnormal; Notable for the following:    Hemoglobin 11.6 (*)    HCT 33.5 (*)    All other components within normal limits  COMPREHENSIVE METABOLIC PANEL  CBC WITH DIFFERENTIAL/PLATELET  I-STAT BETA HCG BLOOD, ED ([facility], [facility], [facility] ONLY)    Imaging Review No results found. I have personally reviewed and evaluated these images and lab results as part of my medical decision-making.   EKG Interpretation None      MDM   Final diagnoses:  Dysfunctional uterine bleeding   45 y.o immunocompromised F presents to the ED with menstrual cramps. Pt had onset of her cycle this morning and experienced associated facial flushing, sweating and dizziness. Suspect this was a hot flash. Pt appears well in ED, in NAD. Pt has no active complaints at this time. Hgb stable. All other blood work wnl. No indication for pelvic exam. No vaginal discharge or pain. Pt is hemodynamically stable and ready for discharge with appropriate follow up. Return precautions outlined in patient discharge instructions.   Case discussed with Dr. Fayrene FearingJames who agrees with treatment plan.     Lester KinsmanSamantha Tripp Sand[place] Knolls[place]ow[place], PA-C 03/18/16 1033  Rolland PorterMark James, MD 03/31/16 [phone]

## 2016-05-16 IMAGING — CR DG ANKLE COMPLETE 3+V*L*
3 series · 3 of 3 positions shown · non-contrast
Comparison: Contemporaneous foot radiographs

CLINICAL DATA: Lateral foot and ankle pain status post twisting
injury.

EXAM:
LEFT ANKLE COMPLETE - 3+ VIEW

[ankle ap]
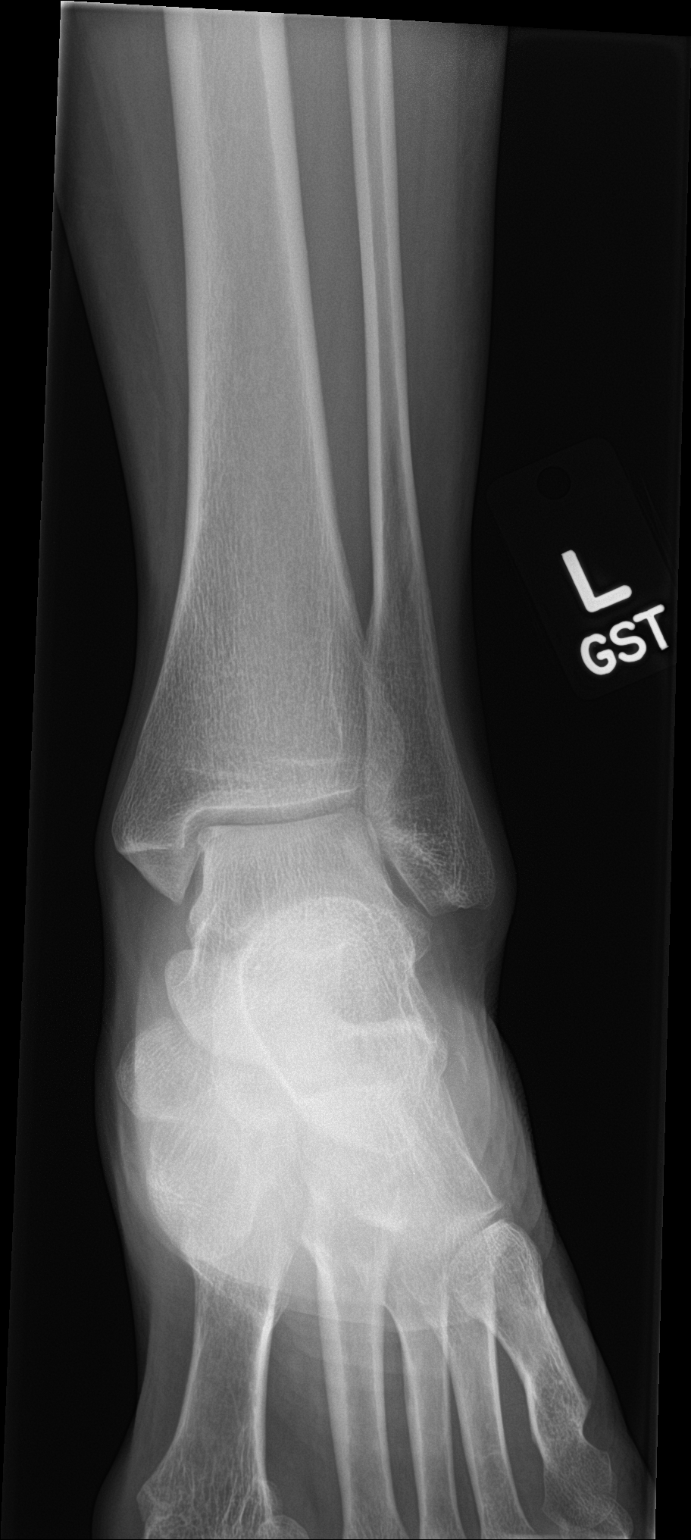

[ankle obl]
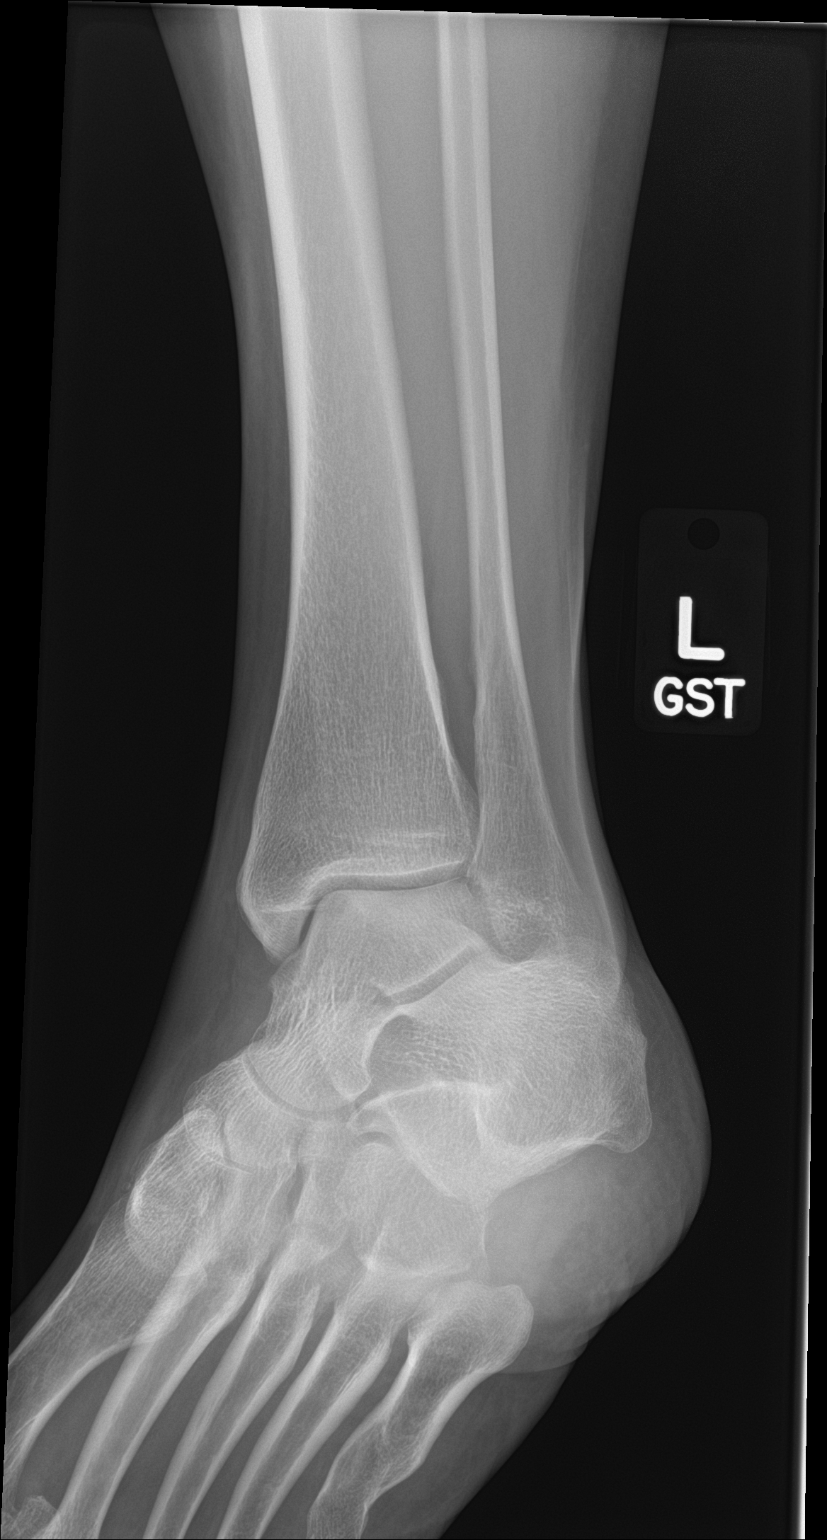

[ankle lat]
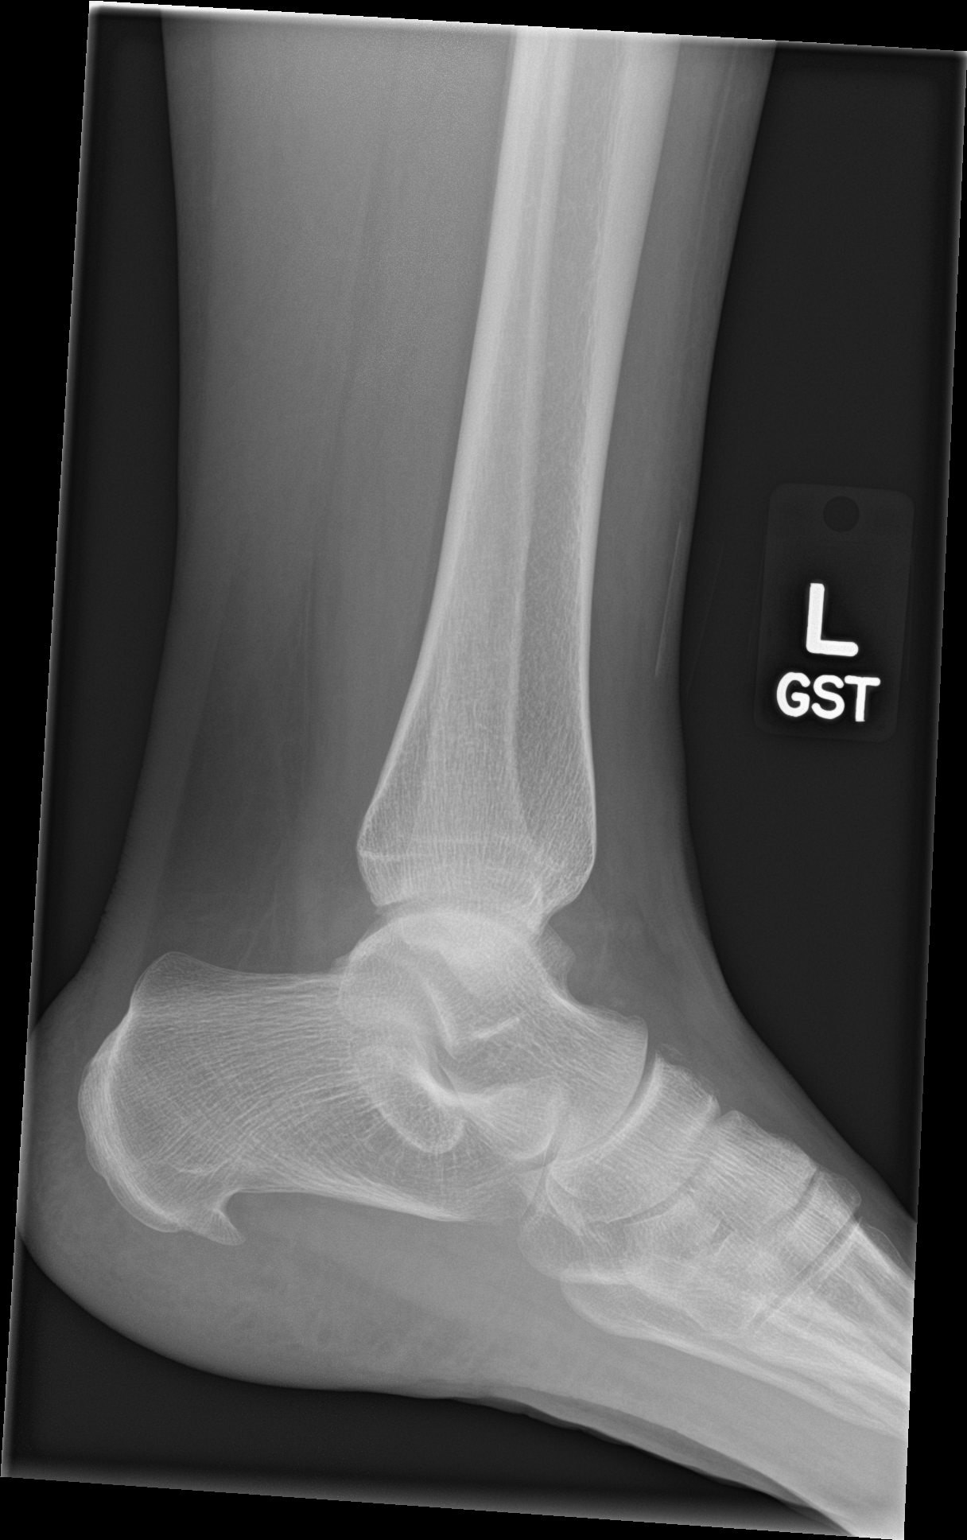

[3 of 3 positions shown; findings below may reference images not displayed]

FINDINGS: Ankle mortise intact. No malleolar fracture identified. Small
calcific densities along the dorsum and lateral aspect of the
navicular an distal talus. Plantar calcaneal enthesophyte.
IMPRESSION: Ankle mortise intact.

Small calcific densities along the dorsum and lateral aspect of the
talus and navicular, may reflect avulsion fractures. Correlate for
point tenderness.

## 2016-09-21 ENCOUNTER — Encounter (HOSPITAL_COMMUNITY): Payer: Self-pay

## 2016-09-21 ENCOUNTER — Emergency Department (HOSPITAL_COMMUNITY)
Admission: EM | Admit: 2016-09-21 | Discharge: 2016-09-21 | Disposition: A | Discharge status: Home / Self Care | Payer: Self-pay | Attending: Emergency Medicine | Admitting: Emergency Medicine

## 2016-09-21 ENCOUNTER — Emergency Department (HOSPITAL_COMMUNITY): Payer: Self-pay

## 2016-09-21 DIAGNOSIS — K8020 Calculus of gallbladder without cholecystitis without obstruction: Secondary | ICD-10-CM | POA: Insufficient documentation

## 2016-09-21 LAB — CBC WITH DIFFERENTIAL/PLATELET
BASOS ABS: 0 10*3/uL (ref 0.0–0.1)
Basophils Relative: 0 %
Eosinophils Absolute: 0 10*3/uL (ref 0.0–0.7)
Eosinophils Relative: 1 %
HEMATOCRIT: 36.6 % (ref 36.0–46.0)
Hemoglobin: 12.3 g/dL (ref 12.0–15.0)
LYMPHS ABS: 1.9 10*3/uL (ref 0.7–4.0)
LYMPHS PCT: 43 %
MCH: 30.1 pg (ref 26.0–34.0)
MCHC: 33.6 g/dL (ref 30.0–36.0)
MCV: 89.7 fL (ref 78.0–100.0)
MONO ABS: 0.4 10*3/uL (ref 0.1–1.0)
Monocytes Relative: 8 %
NEUTROS ABS: 2.2 10*3/uL (ref 1.7–7.7)
Neutrophils Relative %: 48 %
Platelets: 234 10*3/uL (ref 150–400)
RBC: 4.08 MIL/uL (ref 3.87–5.11)
RDW: 14.2 % (ref 11.5–15.5)
WBC: 4.5 10*3/uL (ref 4.0–10.5)

## 2016-09-21 LAB — COMPREHENSIVE METABOLIC PANEL
ALT: 11 U/L — AB (ref 14–54)
AST: 17 U/L (ref 15–41)
Albumin: 3.6 g/dL (ref 3.5–5.0)
Alkaline Phosphatase: 50 U/L (ref 38–126)
Anion gap: 6 (ref 5–15)
BILIRUBIN TOTAL: 0.4 mg/dL (ref 0.3–1.2)
BUN: 6 mg/dL (ref 6–20)
CO2: 29 mmol/L (ref 22–32)
Calcium: 9 mg/dL (ref 8.9–10.3)
Chloride: 105 mmol/L (ref 101–111)
Creatinine, Ser: 0.77 mg/dL (ref 0.44–1.00)
GFR calc Af Amer: >60 mL/min (ref >60)
GFR calc non Af Amer: >60 mL/min (ref >60)
Glucose, Bld: 85 mg/dL (ref 65–99)
Potassium: 3.7 mmol/L (ref 3.5–5.1)
Sodium: 140 mmol/L (ref 135–145)
TOTAL PROTEIN: 7.1 g/dL (ref 6.5–8.1)

## 2016-09-21 LAB — I-STAT BETA HCG BLOOD, ED (MC, WL, AP ONLY): I-stat hCG, quantitative: <5 m[IU]/mL (ref <5)

## 2016-09-21 LAB — LIPASE, BLOOD: LIPASE: 51 U/L (ref 11–51)

## 2016-09-21 MED ORDER — HYDROCODONE-ACETAMINOPHEN 5-325 MG PO TABS: 1.0000 | ORAL_TABLET | Freq: Four times a day (QID) | ORAL | 0 refills | Status: AC | PRN

## 2016-09-21 MED ORDER — ONDANSETRON HCL 4 MG/2ML IJ SOLN
4.0000 mg | Freq: Once | INTRAMUSCULAR | Status: AC
  Administered 2016-09-21: 4 mg via INTRAVENOUS
  Filled 2016-09-21: qty 2

## 2016-09-21 MED ORDER — MORPHINE SULFATE (PF) 4 MG/ML IV SOLN
4.0000 mg | Freq: Once | INTRAVENOUS | Status: AC
  Administered 2016-09-21: 4 mg via INTRAVENOUS
  Filled 2016-09-21: qty 1

## 2016-09-21 NOTE — ED Notes (Signed)
Declined W/C at D/C and was escorted to lobby by RN. 

## 2016-09-21 NOTE — ED Triage Notes (Signed)
Patient here with 4 days of RUQ pain. States that the pain is radiating to back. Pain with any movement, states that she thinks her dog jumped on her. No GI associated complaints

## 2016-09-21 NOTE — ED Notes (Signed)
Patient transported to Ultrasound 

## 2016-09-21 NOTE — ED Provider Notes (Signed)
[facility]ENCY DEPT Provider Note   CSN: [id number] Arrival date & time: 09/21/16  1232     History   Chief Complaint Chief Complaint  Patient presents with  . Right rib pain    HPI Lori Bailey is a 45 y.o. female.  Patient with past medical history of hepatitis C and HIV presents to the emergency department with chief complaint of right upper quadrant pain. She states that the symptoms started a couple of days ago and have been progressively worsening. She states that the pain radiates to her back. She denies any fevers, chills, cough, nausea, or vomiting. She denies any prior abdominal surgeries. She denies any dysuria, hematuria, or vaginal discharge. She denies any other associated symptoms. The symptoms are worsened with movement and palpation.   The history is provided by the patient. No language interpreter was used.    Past Medical History:  Diagnosis Date  . Anemia   . Hepatitis C   . HIV infection (HCC)     There are no active problems to display for this patient.   Past Surgical History:  Procedure Laterality Date  . TUBAL LIGATION      OB History    Gravida Para Term Preterm AB Living   5 5 5     5    SAB TAB Ectopic Multiple Live Births                   Home Medications    Prior to Admission medications   Medication Sig Start Date End Date Taking? Authorizing Provider  naproxen sodium (ANAPROX) 220 MG tablet Take 220 mg by mouth daily as needed (pain).    Historical Provider, MD  ODEFSEY 200-25-25 MG TABS tablet Take 1 tablet by mouth daily. 01/29/16   Historical Provider, MD    Family History Family History  Problem Relation Age of Onset  . Breast cancer Mother   . Cancer Mother     uterine  . Seizures Mother   . Breast cancer Maternal Aunt   . Hypertension Sister   . Hypertension Brother   . Diabetes Brother     Social History Social History  Substance Use Topics  . Smoking status: Never Smoker  . Smokeless tobacco: Never Used   . Alcohol use Yes     Comment: socially on weekends     Allergies   Review of patient's allergies indicates no known allergies.   Review of Systems Review of Systems  Gastrointestinal: Positive for abdominal pain.  All other systems reviewed and are negative.    Physical Exam Updated Vital Signs BP 130/89 (BP Location: Right Arm)   Pulse 65   Temp 97.8 F (36.6 C) (Oral)   Resp 18   SpO2 100%   Physical Exam  Constitutional: She is oriented to person, place, and time. She appears well-developed and well-nourished.  HENT:  Head: Normocephalic and atraumatic.  Eyes: Conjunctivae and EOM are normal. Pupils are equal, round, and reactive to light.  Neck: Normal range of motion. Neck supple.  Cardiovascular: Normal rate and regular rhythm.  Exam reveals no gallop and no friction rub.   No murmur heard. Pulmonary/Chest: Effort normal and breath sounds normal. No respiratory distress. She has no wheezes. She has no rales. She exhibits no tenderness.  Abdominal: Soft. Bowel sounds are normal. She exhibits no distension and no mass. There is tenderness. There is no rebound and no guarding.  Right upper quadrant tenderness palpation, no other focal abdominal tenderness  Musculoskeletal: Normal range of motion. She exhibits no edema or tenderness.  Neurological: She is alert and oriented to person, place, and time.  Skin: Skin is warm and dry.  Psychiatric: She has a normal mood and affect. Her behavior is normal. Judgment and thought content normal.  Nursing note and vitals reviewed.    ED Treatments / Results  Labs (all labs ordered are listed, but only abnormal results are displayed) Labs Reviewed  COMPREHENSIVE METABOLIC PANEL - Abnormal; Notable for the following:       Result Value   ALT 11 (*)    All other components within normal limits  CBC WITH DIFFERENTIAL/PLATELET  LIPASE, BLOOD  I-STAT BETA HCG BLOOD, ED ([facility], [facility], [facility] ONLY)    EKG  EKG  Interpretation None       Radiology [place] Abdomen Limited  Result Date: 09/21/2016 CLINICAL DATA:  Right upper quadrant pain EXAM: US ABDOMEN LIMITED - RIGHT UPPER QUADRANT COMPARISON:  None. FINDINGS: Gallbladder: Well distended with evidence of gallstones. No gallbladder wall thickening or pericholecystic fluid is noted. Common bile duct: Diameter: 2.3 mm. Liver: No focal lesion identified. Within normal limits in parenchymal echogenicity. IMPRESSION: Cholelithiasis without complicating factors. Electronically Signed   By: Alcide CleverMark  Lukens M.D.   On: 09/21/2016 15:24    Procedures Procedures (including critical care time)  Medications Ordered in ED Medications  morphine 4 MG/ML injection 4 mg (not administered)  ondansetron (ZOFRAN) injection 4 mg (not administered)     Initial Impression / Assessment and Plan / ED Course  I have reviewed the triage vital signs and the nursing notes.  Pertinent labs & imaging results that were available during my care of the patient were reviewed by me and considered in my medical decision making (see chart for details).  Clinical Course    Patient with right upper quadrant pain times a couple of days. Will check labs in right upper quadrant ultrasound.  Laboratory workup is reassuring. Lipase and LFTs are not elevated.  Right upper quadrant ultrasound is consistent with cholelithiasis without any complicating features. I will recommend that the patient follow-up with general surgery. Will prescribe some Vicodin. Patient is stable and ready for discharge. Final Clinical Impressions(s) / ED Diagnoses   Final diagnoses:  Calculus of gallbladder without cholecystitis without obstruction    New Prescriptions New Prescriptions   HYDROCODONE-ACETAMINOPHEN (NORCO/VICODIN) 5-325 MG TABLET    Take 1-2 tablets by mouth every 6 (six) hours as needed.     Roxy Horsemanobert Tasheem Elms, PA-C 09/21/16 1543    Cathren LaineKevin Steinl, MD 09/26/16 [phone]

## 2017-05-07 ENCOUNTER — Other Ambulatory Visit (HOSPITAL_COMMUNITY): Payer: Self-pay | Admitting: *Deleted

## 2017-05-07 DIAGNOSIS — N644 Mastodynia: Secondary | ICD-10-CM

## 2017-05-15 ENCOUNTER — Ambulatory Visit (HOSPITAL_COMMUNITY): Payer: Self-pay

## 2017-05-15 ENCOUNTER — Other Ambulatory Visit: Payer: Self-pay

## 2017-10-10 ENCOUNTER — Encounter (HOSPITAL_COMMUNITY): Payer: Self-pay

## 2017-11-02 ENCOUNTER — Other Ambulatory Visit: Payer: Self-pay

## 2017-11-02 ENCOUNTER — Encounter (HOSPITAL_COMMUNITY): Payer: Self-pay

## 2017-11-02 ENCOUNTER — Emergency Department (HOSPITAL_COMMUNITY)
Admission: EM | Admit: 2017-11-02 | Discharge: 2017-11-02 | Disposition: A | Discharge status: Home / Self Care | Payer: Self-pay | Attending: Emergency Medicine | Admitting: Emergency Medicine

## 2017-11-02 ENCOUNTER — Emergency Department (HOSPITAL_COMMUNITY): Payer: Self-pay

## 2017-11-02 DIAGNOSIS — Z79899 Other long term (current) drug therapy: Secondary | ICD-10-CM | POA: Insufficient documentation

## 2017-11-02 DIAGNOSIS — Y939 Activity, unspecified: Secondary | ICD-10-CM | POA: Insufficient documentation

## 2017-11-02 DIAGNOSIS — Y929 Unspecified place or not applicable: Secondary | ICD-10-CM | POA: Insufficient documentation

## 2017-11-02 DIAGNOSIS — S93402A Sprain of unspecified ligament of left ankle, initial encounter: Secondary | ICD-10-CM | POA: Insufficient documentation

## 2017-11-02 DIAGNOSIS — W1842XA Slipping, tripping and stumbling without falling due to stepping into hole or opening, initial encounter: Secondary | ICD-10-CM | POA: Insufficient documentation

## 2017-11-02 DIAGNOSIS — B20 Human immunodeficiency virus [HIV] disease: Secondary | ICD-10-CM | POA: Insufficient documentation

## 2017-11-02 DIAGNOSIS — Y998 Other external cause status: Secondary | ICD-10-CM | POA: Insufficient documentation

## 2017-11-02 NOTE — Discharge Instructions (Signed)
Please read the attached information regarding ankle range of motion exercises. Ice, elevate and rest the extremity. Take over-the-counter Tylenol as needed for pain. Follow-up with your primary care provider for further evaluation. Gradually bear weight on the extremity as tolerated. Return to ED for worsening pain, red hot or tender joints, numbness in legs, additional injuries or falls.
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
## 2017-11-02 NOTE — ED Notes (Signed)
PT states understanding of care given, follow up care. PT ambulated from ED to car with a steady gait.  

## 2018-07-05 ENCOUNTER — Telehealth (HOSPITAL_COMMUNITY): Payer: Self-pay

## 2018-07-05 NOTE — Telephone Encounter (Signed)
Tried to reach call could not be completed at the time °

## 2018-09-26 IMAGING — US US ABDOMEN LIMITED
1 series · 14 of 25 positions shown · non-contrast
Comparison: None.

CLINICAL DATA: Right upper quadrant pain

EXAM:
US ABDOMEN LIMITED - RIGHT UPPER QUADRANT

[Series 1: us abdomen limited · 0.20mm/px · 14 of 42 slices shown]
[im 1/42]
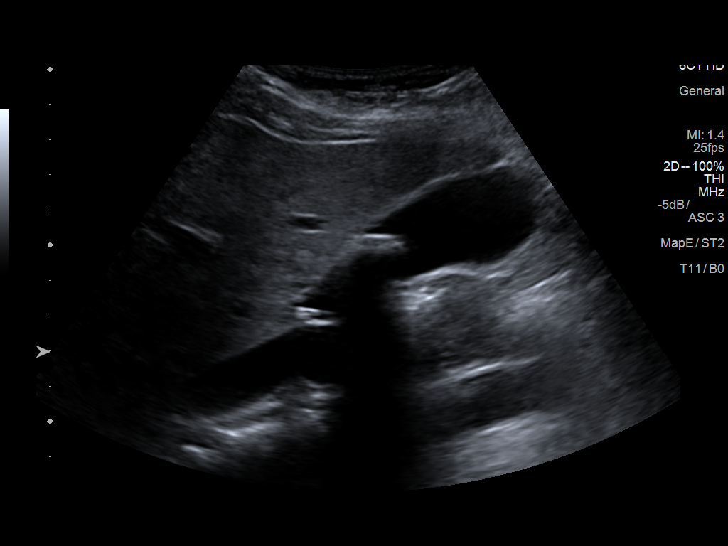
[im 4/42]
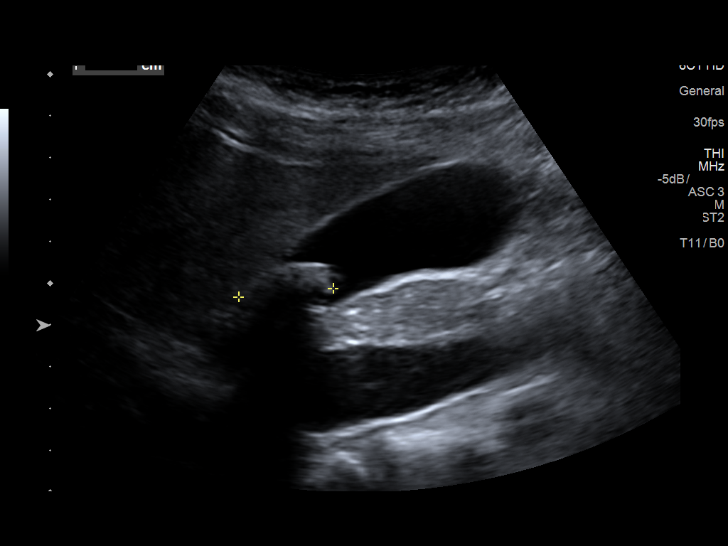
[im 7/42]
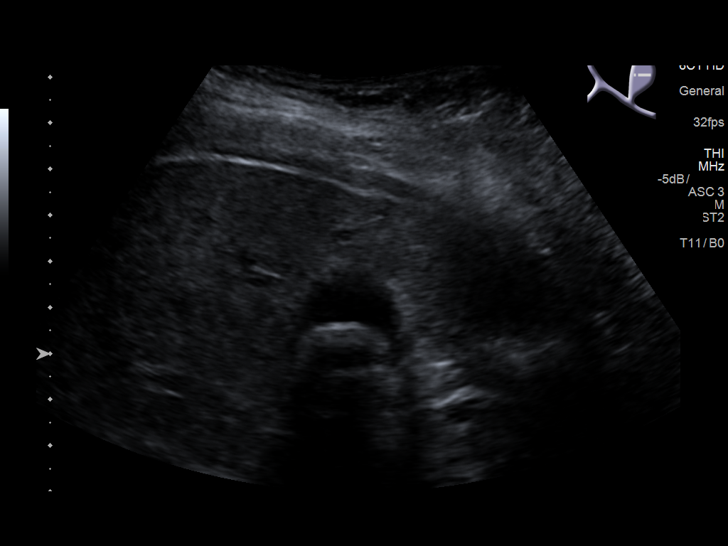
[im 11/42]
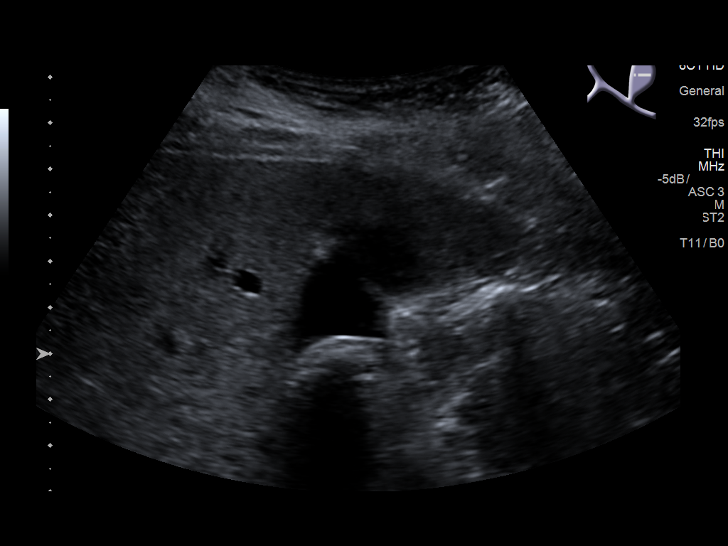
[im 14/42]
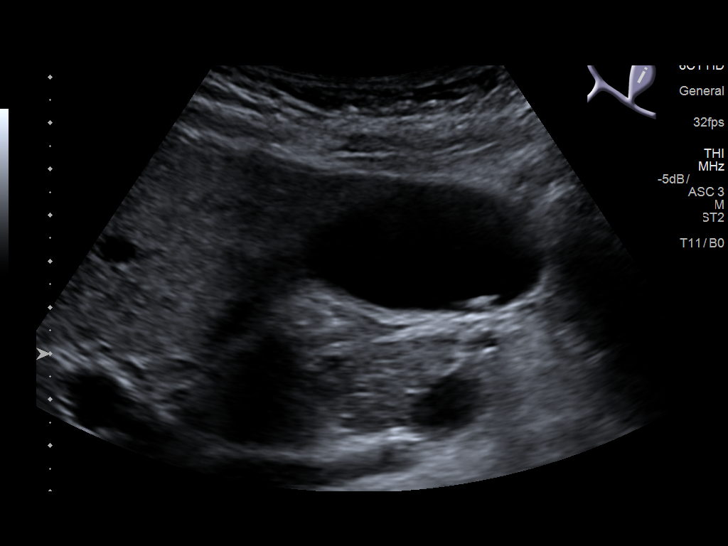
[im 16/42]
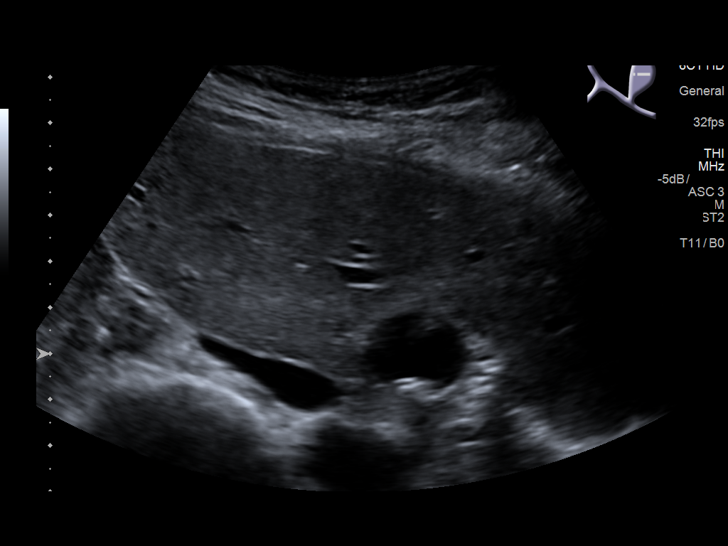
[im 19/42]
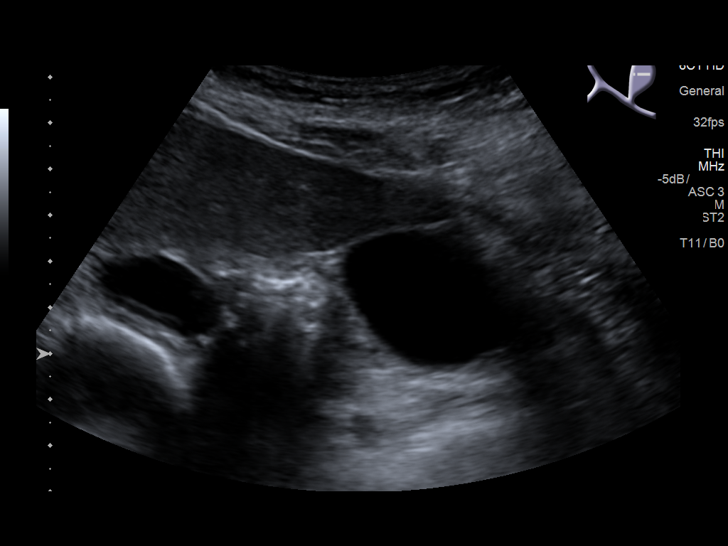
[im 23/42]
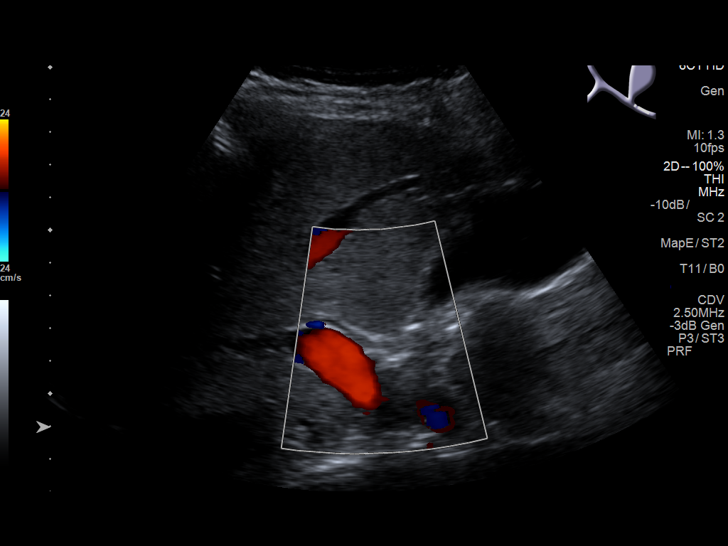
[im 26/42]
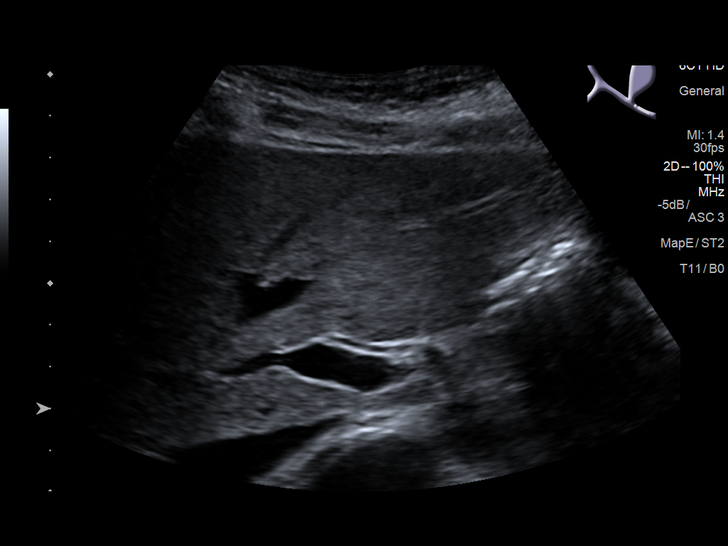
[im 28/42]
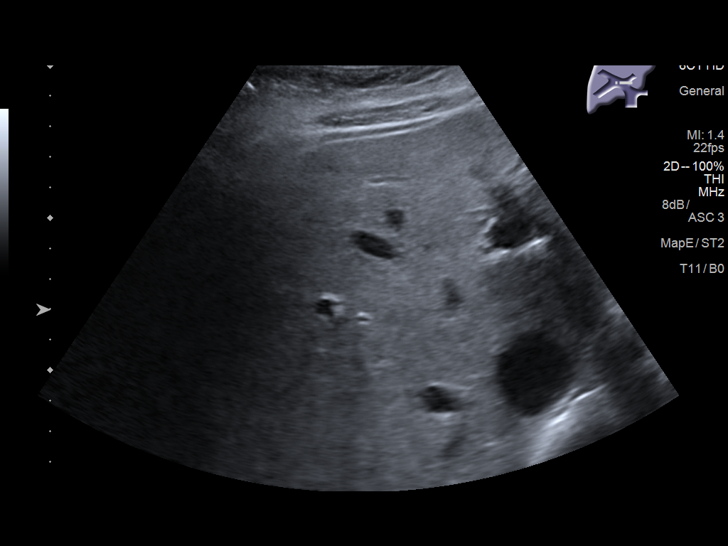
[im 31/42]
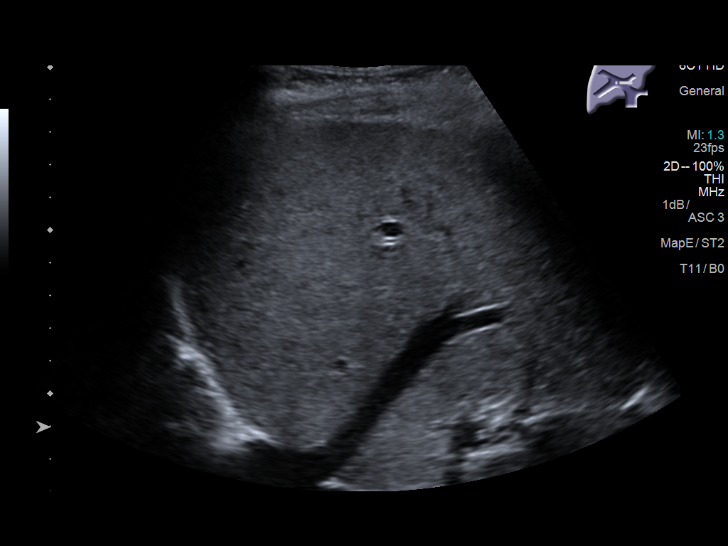
[im 35/42]
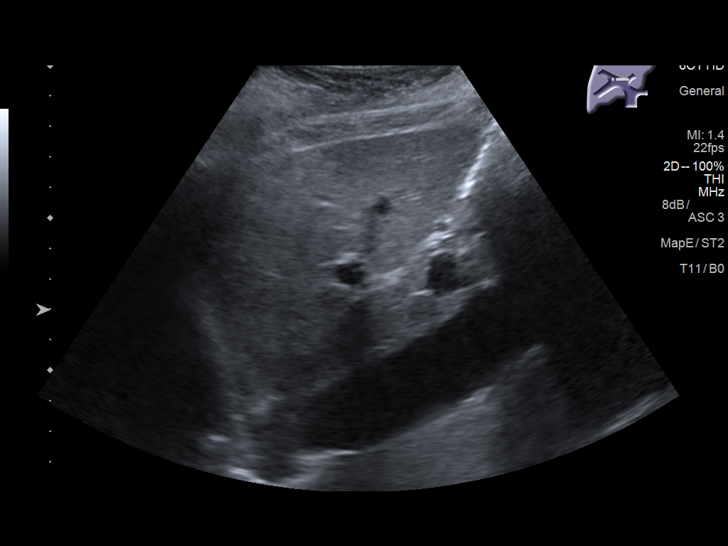
[im 38/42]
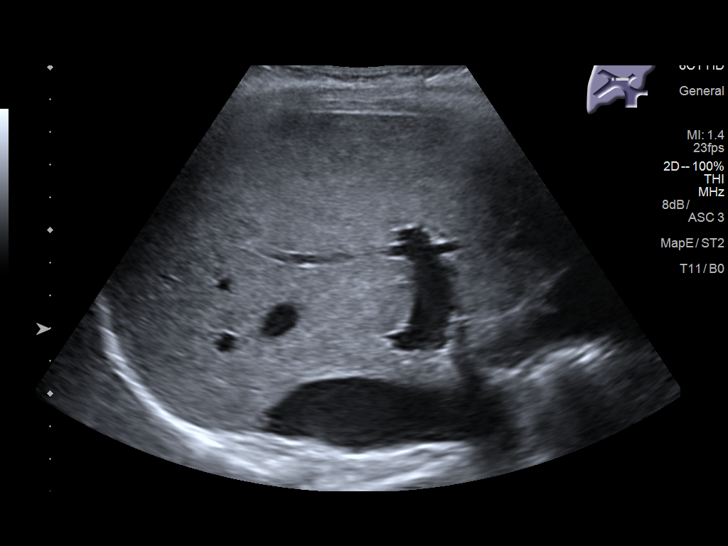
[im 42/42]
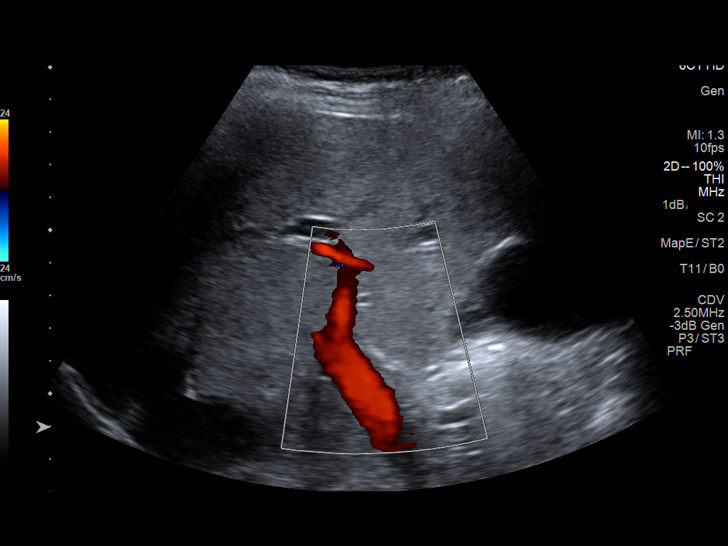

[14 of 25 positions shown; findings below may reference images not displayed]

FINDINGS: Gallbladder:

Well distended with evidence of gallstones. No gallbladder wall
thickening or pericholecystic fluid is noted.

Common bile duct:

Diameter: 2.3 mm.

Liver:

No focal lesion identified. Within normal limits in parenchymal
echogenicity.
IMPRESSION: Cholelithiasis without complicating factors.
